# Patient Record
Sex: Female | Born: 1950
Health system: Southern US, Community
[De-identification: ages and names within clinical notes are randomized; demographics above are authoritative.]

## PROBLEM LIST (undated history)

## (undated) DIAGNOSIS — H9319 Tinnitus, unspecified ear: Secondary | ICD-10-CM

## (undated) DIAGNOSIS — K589 Irritable bowel syndrome without diarrhea: Secondary | ICD-10-CM

## (undated) DIAGNOSIS — K649 Unspecified hemorrhoids: Secondary | ICD-10-CM

## (undated) DIAGNOSIS — I341 Nonrheumatic mitral (valve) prolapse: Secondary | ICD-10-CM

## (undated) DIAGNOSIS — F32A Depression, unspecified: Secondary | ICD-10-CM

## (undated) DIAGNOSIS — K56609 Unspecified intestinal obstruction, unspecified as to partial versus complete obstruction: Secondary | ICD-10-CM

## (undated) DIAGNOSIS — E039 Hypothyroidism, unspecified: Secondary | ICD-10-CM

## (undated) DIAGNOSIS — K602 Anal fissure, unspecified: Secondary | ICD-10-CM

## (undated) DIAGNOSIS — M1990 Unspecified osteoarthritis, unspecified site: Secondary | ICD-10-CM

## (undated) DIAGNOSIS — F329 Major depressive disorder, single episode, unspecified: Secondary | ICD-10-CM

## (undated) DIAGNOSIS — K624 Stenosis of anus and rectum: Secondary | ICD-10-CM

## (undated) DIAGNOSIS — M810 Age-related osteoporosis without current pathological fracture: Secondary | ICD-10-CM

## (undated) HISTORY — PX: ROTATOR CUFF REPAIR: SHX139

## (undated) HISTORY — DX: Unspecified hemorrhoids: K64.9

## (undated) HISTORY — DX: Depression, unspecified: F32.A

## (undated) HISTORY — PX: COLONOSCOPY: SHX174

## (undated) HISTORY — DX: Age-related osteoporosis without current pathological fracture: M81.0

## (undated) HISTORY — PX: OTHER SURGICAL HISTORY: SHX169

## (undated) HISTORY — PX: BREAST BIOPSY: SHX20

## (undated) HISTORY — DX: Hypothyroidism, unspecified: E03.9

## (undated) HISTORY — DX: Unspecified osteoarthritis, unspecified site: M19.90

## (undated) HISTORY — DX: Major depressive disorder, single episode, unspecified: F32.9

## (undated) HISTORY — PX: ANAL FISSURE REPAIR: SHX2312

## (undated) HISTORY — PX: MOUTH SURGERY: SHX715

## (undated) HISTORY — PX: DENTAL SURGERY: SHX609

## (undated) HISTORY — DX: Tinnitus, unspecified ear: H93.19

## (undated) HISTORY — DX: Nonrheumatic mitral (valve) prolapse: I34.1

## (undated) HISTORY — DX: Unspecified intestinal obstruction, unspecified as to partial versus complete obstruction: K56.609

## (undated) HISTORY — DX: Stenosis of anus and rectum: K62.4

## (undated) HISTORY — DX: Anal fissure, unspecified: K60.2

## (undated) HISTORY — DX: Irritable bowel syndrome, unspecified: K58.9

---

## 1998-10-30 HISTORY — PX: CYSTOSCOPY W/ SPINCTEROTOMY: SUR378

## 1999-08-19 ENCOUNTER — Ambulatory Visit (HOSPITAL_COMMUNITY): Admission: RE | Admit: 1999-08-19 | Discharge: 1999-08-19 | Payer: Self-pay | Admitting: General Surgery

## 1999-12-06 ENCOUNTER — Other Ambulatory Visit: Admission: RE | Admit: 1999-12-06 | Discharge: 1999-12-06 | Payer: Self-pay | Admitting: Obstetrics & Gynecology

## 2001-01-14 ENCOUNTER — Other Ambulatory Visit: Admission: RE | Admit: 2001-01-14 | Discharge: 2001-01-14 | Payer: Self-pay | Admitting: Obstetrics & Gynecology

## 2002-02-12 ENCOUNTER — Other Ambulatory Visit: Admission: RE | Admit: 2002-02-12 | Discharge: 2002-02-12 | Payer: Self-pay | Admitting: Obstetrics and Gynecology

## 2003-02-19 ENCOUNTER — Other Ambulatory Visit: Admission: RE | Admit: 2003-02-19 | Discharge: 2003-02-19 | Payer: Self-pay | Admitting: Obstetrics & Gynecology

## 2003-04-08 ENCOUNTER — Encounter: Payer: Self-pay | Admitting: Internal Medicine

## 2004-03-16 ENCOUNTER — Other Ambulatory Visit: Admission: RE | Admit: 2004-03-16 | Discharge: 2004-03-16 | Payer: Self-pay | Admitting: Obstetrics & Gynecology

## 2004-04-14 ENCOUNTER — Encounter: Admission: RE | Admit: 2004-04-14 | Discharge: 2004-04-14 | Payer: Self-pay | Admitting: Obstetrics & Gynecology

## 2004-08-22 ENCOUNTER — Encounter: Admission: RE | Admit: 2004-08-22 | Discharge: 2004-08-22 | Payer: Self-pay | Admitting: Endocrinology

## 2004-08-22 ENCOUNTER — Encounter (INDEPENDENT_AMBULATORY_CARE_PROVIDER_SITE_OTHER): Payer: Self-pay | Admitting: *Deleted

## 2004-09-21 ENCOUNTER — Ambulatory Visit: Payer: Self-pay | Admitting: Internal Medicine

## 2004-09-30 ENCOUNTER — Ambulatory Visit (HOSPITAL_COMMUNITY): Admission: RE | Admit: 2004-09-30 | Discharge: 2004-09-30 | Payer: Self-pay | Admitting: Internal Medicine

## 2004-09-30 ENCOUNTER — Encounter (INDEPENDENT_AMBULATORY_CARE_PROVIDER_SITE_OTHER): Payer: Self-pay | Admitting: *Deleted

## 2004-10-17 ENCOUNTER — Encounter: Admission: RE | Admit: 2004-10-17 | Discharge: 2004-10-17 | Payer: Self-pay | Admitting: Obstetrics & Gynecology

## 2005-03-28 ENCOUNTER — Encounter: Admission: RE | Admit: 2005-03-28 | Discharge: 2005-03-28 | Payer: Self-pay | Admitting: Obstetrics & Gynecology

## 2005-04-04 ENCOUNTER — Other Ambulatory Visit: Admission: RE | Admit: 2005-04-04 | Discharge: 2005-04-04 | Payer: Self-pay | Admitting: Obstetrics & Gynecology

## 2006-02-22 ENCOUNTER — Ambulatory Visit: Payer: Self-pay | Admitting: Internal Medicine

## 2006-04-03 ENCOUNTER — Emergency Department (HOSPITAL_COMMUNITY): Admission: EM | Admit: 2006-04-03 | Discharge: 2006-04-03 | Payer: Self-pay | Admitting: *Deleted

## 2006-04-30 ENCOUNTER — Encounter: Admission: RE | Admit: 2006-04-30 | Discharge: 2006-04-30 | Payer: Self-pay | Admitting: Obstetrics & Gynecology

## 2006-08-15 ENCOUNTER — Ambulatory Visit: Payer: Self-pay | Admitting: Internal Medicine

## 2007-05-07 ENCOUNTER — Encounter: Admission: RE | Admit: 2007-05-07 | Discharge: 2007-05-07 | Payer: Self-pay | Admitting: Obstetrics & Gynecology

## 2008-01-24 ENCOUNTER — Ambulatory Visit: Payer: Self-pay | Admitting: Vascular Surgery

## 2008-05-20 ENCOUNTER — Encounter: Admission: RE | Admit: 2008-05-20 | Discharge: 2008-05-20 | Payer: Self-pay | Admitting: Obstetrics & Gynecology

## 2009-05-14 ENCOUNTER — Telehealth: Payer: Self-pay | Admitting: Internal Medicine

## 2009-05-25 ENCOUNTER — Encounter: Admission: RE | Admit: 2009-05-25 | Discharge: 2009-05-25 | Payer: Self-pay | Admitting: Obstetrics & Gynecology

## 2010-01-11 ENCOUNTER — Encounter (INDEPENDENT_AMBULATORY_CARE_PROVIDER_SITE_OTHER): Payer: Self-pay | Admitting: *Deleted

## 2010-01-11 ENCOUNTER — Encounter: Admission: RE | Admit: 2010-01-11 | Discharge: 2010-01-11 | Payer: Self-pay

## 2010-01-12 ENCOUNTER — Encounter (INDEPENDENT_AMBULATORY_CARE_PROVIDER_SITE_OTHER): Payer: Self-pay | Admitting: *Deleted

## 2010-01-12 ENCOUNTER — Inpatient Hospital Stay (HOSPITAL_COMMUNITY): Admission: EM | Admit: 2010-01-12 | Discharge: 2010-01-20 | Payer: Self-pay | Admitting: Emergency Medicine

## 2010-01-13 ENCOUNTER — Encounter (INDEPENDENT_AMBULATORY_CARE_PROVIDER_SITE_OTHER): Payer: Self-pay | Admitting: *Deleted

## 2010-01-14 ENCOUNTER — Encounter (INDEPENDENT_AMBULATORY_CARE_PROVIDER_SITE_OTHER): Payer: Self-pay | Admitting: *Deleted

## 2010-01-17 ENCOUNTER — Encounter (INDEPENDENT_AMBULATORY_CARE_PROVIDER_SITE_OTHER): Payer: Self-pay | Admitting: *Deleted

## 2010-01-18 ENCOUNTER — Ambulatory Visit: Payer: Self-pay | Admitting: Internal Medicine

## 2010-01-18 ENCOUNTER — Encounter (INDEPENDENT_AMBULATORY_CARE_PROVIDER_SITE_OTHER): Payer: Self-pay | Admitting: *Deleted

## 2010-01-19 ENCOUNTER — Encounter (INDEPENDENT_AMBULATORY_CARE_PROVIDER_SITE_OTHER): Payer: Self-pay | Admitting: *Deleted

## 2010-01-26 DIAGNOSIS — Z8719 Personal history of other diseases of the digestive system: Secondary | ICD-10-CM | POA: Insufficient documentation

## 2010-01-26 DIAGNOSIS — Z872 Personal history of diseases of the skin and subcutaneous tissue: Secondary | ICD-10-CM | POA: Insufficient documentation

## 2010-01-26 DIAGNOSIS — K589 Irritable bowel syndrome without diarrhea: Secondary | ICD-10-CM | POA: Insufficient documentation

## 2010-01-26 DIAGNOSIS — K645 Perianal venous thrombosis: Secondary | ICD-10-CM | POA: Insufficient documentation

## 2010-01-26 DIAGNOSIS — E039 Hypothyroidism, unspecified: Secondary | ICD-10-CM | POA: Insufficient documentation

## 2010-01-26 DIAGNOSIS — K5900 Constipation, unspecified: Secondary | ICD-10-CM | POA: Insufficient documentation

## 2010-01-26 DIAGNOSIS — I059 Rheumatic mitral valve disease, unspecified: Secondary | ICD-10-CM | POA: Insufficient documentation

## 2010-01-31 ENCOUNTER — Ambulatory Visit: Payer: Self-pay | Admitting: Internal Medicine

## 2010-03-20 ENCOUNTER — Telehealth: Payer: Self-pay | Admitting: Gastroenterology

## 2010-03-20 ENCOUNTER — Emergency Department (HOSPITAL_COMMUNITY): Admission: EM | Admit: 2010-03-20 | Discharge: 2010-03-21 | Payer: Self-pay | Admitting: Emergency Medicine

## 2010-03-20 ENCOUNTER — Encounter: Payer: Self-pay | Admitting: Internal Medicine

## 2010-03-21 ENCOUNTER — Telehealth: Payer: Self-pay | Admitting: Internal Medicine

## 2010-03-21 ENCOUNTER — Encounter: Payer: Self-pay | Admitting: Internal Medicine

## 2010-03-29 ENCOUNTER — Ambulatory Visit: Payer: Self-pay | Admitting: Internal Medicine

## 2010-03-29 DIAGNOSIS — R109 Unspecified abdominal pain: Secondary | ICD-10-CM | POA: Insufficient documentation

## 2010-04-11 ENCOUNTER — Ambulatory Visit (HOSPITAL_COMMUNITY): Admission: RE | Admit: 2010-04-11 | Discharge: 2010-04-11 | Payer: Self-pay | Admitting: Internal Medicine

## 2010-05-24 ENCOUNTER — Encounter: Payer: Self-pay | Admitting: Internal Medicine

## 2010-06-23 ENCOUNTER — Encounter: Admission: RE | Admit: 2010-06-23 | Discharge: 2010-06-23 | Payer: Self-pay | Admitting: Obstetrics & Gynecology

## 2010-11-29 NOTE — Letter (Signed)
Summary: New Patient letter  [facility]  [address] [place], [place] [address]   Phone: [phone]  Fax: [phone]       01/19/2010 MRN: [id number]  Karen Mccarthy [address] [place], [place]  [address]  Dear Ms. Zimmers,  Welcome to the Gastroenterology Division at [facility].    You are scheduled to see Dr. Lina Sar on 01-31-10 at 9am,  on the 3rd floor at [facility], [address]  We ask that you try to arrive at our office 15 minutes prior to your appointment time to allow for check-in.  We would like you to complete the enclosed self-administered evaluation form prior to your visit and bring it with you on the day of your appointment.  We will review it with you.  Also, please bring a complete list of all your medications or, if you prefer, bring the medication bottles and we will list them.  Please bring your insurance card so that we may make a copy of it.  If your insurance requires a referral to see a specialist, please bring your referral form from your primary care physician.  Co-payments are due at the time of your visit and may be paid by cash, check or credit card.     Your office visit will consist of a consult with your physician (includes a physical exam), any laboratory testing he/she may order, scheduling of any necessary diagnostic testing (e.g. x-ray, ultrasound, CT-scan), and scheduling of a procedure (e.g. Endoscopy, Colonoscopy) if required.  Please allow enough time on your schedule to allow for any/all of these possibilities.    If you cannot keep your appointment, please call [phone] to cancel or reschedule prior to your appointment date.  This allows [place] the opportunity to schedule an appointment for another patient in need of care.  If you do not cancel or reschedule by 5 p.m. the business day prior to your appointment date, you will be charged a $50.00 late cancellation/no-show fee.    Thank you for choosing   Gastroenterology for your medical needs.  We appreciate the opportunity to care for you.  Please visit [place] at our website  to learn more about our practice.                     Sincerely,                                                             The Gastroenterology Division   Appended Document: New Patient letter Letter mailed to patient.

## 2010-11-29 NOTE — Progress Notes (Signed)
Summary: Triage   Phone Note Call from Patient Call back at [phone] Cell   Caller: Patient Call For: Dr. Juanda Chance Reason for Call: Talk to Nurse Summary of Call: Pt was seen in ER last night for severe abd pain and vomiting. She said her CT scan and showed some inflammation in colon and constipation in bowel. Was told to f/u in a few days Initial call taken by: Karna Christmas,  Mar 21, 2010 8:08 AM  Follow-up for Phone Call        Patient  was seen in the ER last night for abdominal pain, nausea and vomiting.  She was treated for colitis and asked to follow up with Dr Juanda Chance.  Patient  will be scheduled to see Dr Juanda Chance 03/29/10 9:00.  Patient  was sent home for the ER at 7:30 this am.  She is asked to continue the antibiotics prescribed to her and call us back if she developes new symptoms and then we will work her in with the RNP as Dr Juanda Chance is the hospital MD this week.  Patient  verbalized understanding to call back if she developes nausea and  vomiting or abdominal pain.    Follow-up by: Darcey Nora RN, CGRN,  Mar 21, 2010 9:11 AM

## 2010-11-29 NOTE — Letter (Signed)
Summary: Nausea, Vomiting, Abdominal Pain  NAME:  Karen Mccarthy, Karen Mccarthy                ACCOUNT NO.:  [id number]      MEDICAL RECORD NO.:  [id number]          PATIENT TYPE:  INP      LOCATION:  1337                         FACILITY:  [facility]      PHYSICIAN:  Peggye Pitt, M.D. DATE OF BIRTH:  03-10-1951                                    PROGRESS NOTE      DATE OF DISCHARGE:   Still to be determined.      DIAGNOSES UP TO DATE:   1. Nausea, vomiting and abdominal pain, improved, presumed secondary       to ileus versus partial small bowel obstruction.   2. Hypothyroidism.   3. Irritable bowel syndrome of the constipation type.   4. Mitral valve prolapse.   5. History of hemorrhoids and anal fissures.      HOSPITAL COURSE:   [name] is a very pleasant 60 year old Caucasian lady, initially   admitted on March 16 with nausea, vomiting and abdominal pain.  She   denied any sick contacts, any fevers or chills.  She states she had a   normal colonoscopy in 2004 and was told that she does not need another   one until 10 years.  She had a CAT scan in the emergency department that   showed a normal appendix with slightly dilated distal small bowel loops   and a question of partial small bowel obstruction, some left hepatic   cyst, but was otherwise negative.  [name] has had a very slow   clinical resolution of her partial small bowel obstruction, although on   subsequent abdominal x-rays, it has resolved.  Dr. Magnus Ivan did order an   upper GI study that did not show any obvious causes of obstruction or   stricturing, but she did have very delayed transit.  She is now on a   full liquid diet.  She has not had any emesis for the past 2 days.  She   has been having some loose bowel movements which she thinks are mostly   the barium that she received for the upper GI study.  Her abdominal pain   has resolved.  Dr. Magnus Ivan has recommended that GI see her.  [name]   has seen Dr. Lina Sar  with [facility] in the past, and I have asked   them to consult on her case.  Hopefully, Ms. Certain will not have any   further emesis and will be able to advance her diet and hopefully   discharge her home within the next 1-2 days.               Peggye Pitt, M.D.            EH/MEDQ  D:  01/18/2010  T:  01/18/2010  Job:  [id number]

## 2010-11-29 NOTE — Progress Notes (Signed)
   Phone Note Call from Patient   Caller: Patient Summary of Call: On call note. Pt relates lower abdominal pain and vomiting that started this afternoon has has worsened. Recently hospitalized for similar problem. Advised to have someone drive her to [facility] ED for evaluation. She agrees. Initial call taken by: Meryl Dare MD Clementeen Graham,  Mar 21, 2010 9:47 AM

## 2010-11-29 NOTE — Procedures (Signed)
Summary: COLON   Colonoscopy  Procedure date:  04/08/2003  Findings:      Location:  [facility].    Procedures Next Due Date:    Colonoscopy: 03/2013 Patient Name: Lataya, Varnell MRN:  Procedure Procedures: Colonoscopy CPT: 32951.  Personnel: Endoscopist: Deziah Renwick L. Juanda Chance, MD.  Referred By: Freddy Finner, M.D.  Exam Location: Exam performed in Outpatient Clinic. Outpatient  Patient Consent: Procedure, Alternatives, Risks and Benefits discussed, consent obtained, from patient. Consent was obtained by the RN.  Indications Symptoms: Constipation  Average Risk Screening Routine.  History  Pre-Exam Physical: Performed Apr 08, 2003. Cardio-pulmonary exam, Rectal exam, HEENT exam , Abdominal exam, Extremity exam, Neurological exam, Mental status exam WNL.  Exam Exam: Extent of exam reached: Cecum, extent intended: Cecum.  The cecum was identified by appendiceal orifice and IC valve. Colon retroflexion performed. Images taken. ASA Classification: I. Tolerance: good.  Monitoring: Pulse and BP monitoring, Oximetry used. Supplemental O2 given.  Colon Prep Used Phospho Soda for colon prep. Prep results: good.  Sedation Meds: Patient assessed and found to be appropriate for moderate (conscious) sedation. Fentanyl 150 mcg. given IV. Versed 12 given IV.  Findings NORMAL EXAM: Cecum.  NORMAL EXAM: Rectum.   Assessment Normal examination.  Comments: thick folds left colon c/w prediverticular changes of the left colon Events  Unplanned Interventions: No intervention was required.  Unplanned Events: There were no complications. Plans Medication Plan: Miralax: 17gm QD, starting Apr 08, 2003   Patient Education: Patient given standard instructions for: Constipation.Yearly hemoccult testing recommended. Patient instructed to get routine colonoscopy every 10 years.  Disposition: After procedure patient sent to recovery. After recovery patient sent home.    This report was created from the original endoscopy report, which was reviewed and signed by the above listed endoscopist.

## 2010-11-29 NOTE — Assessment & Plan Note (Signed)
Summary: post ER visit / abdominal /Karen Mccarthy    History of Present Illness Visit Type: Follow-up Visit Primary GI MD: Lina Sar MD Primary Provider: Theodis Blaze, [facility] Requesting Provider: na Chief Complaint: Post ER f/u for lower abd pain. Pt c/o weight loss and constipation   History of Present Illness:   This is a 60 year old white female with intermittent abdominal pain and a suspected partial small bowel obstruction. She was hospitalized in March 2011 with abdominal pain and an abnormal CT scan suggestive of a small bowel obstruction. A small bowel follow through showed prolonged transit time lasting more than 24 hours. She had recurrent pain in May and was evaluated in the emergency room where her KUB showed loops of the dilated small bowel in the left upper quadrant. Her repeat CT scan showed stranding in the small bowel mesentery as well as in the left lower quadrantraisung a question of colitis. Her last colonoscopy in 2004 was normal. There is a family history of gallbladder disease in several cousins and a maternal aunts. She would like to have her gall bladder evaluated further. She has been maintaining a low residue diet. Her weight has decreased from 117 pounds to 110 pounds because she has been afraid to eat.   GI Review of Systems      Denies abdominal pain, acid reflux, belching, bloating, chest pain, dysphagia with liquids, dysphagia with solids, heartburn, loss of appetite, nausea, vomiting, vomiting blood, weight loss, and  weight gain.      Reports constipation.     Denies anal fissure, black tarry stools, change in bowel habit, diarrhea, diverticulosis, fecal incontinence, heme positive stool, hemorrhoids, irritable bowel syndrome, jaundice, light color stool, liver problems, rectal bleeding, and  rectal pain.    Current Medications (verified): 1)  Budeprion Xl 150 Mg Xr24h-Tab (Bupropion Hcl) .... Take 1 Tablet By Mouth Once A Day 2)  Calcium-Magnesium Powder .... Take 1  Packet Per Day 3)  Clonazepam 0.5 Mg Tabs (Clonazepam) .... Take 1 Tab By Mouth At Bedtime As Needed For Insomnia 4)  Colace 100 Mg Caps (Docusate Sodium) .... Take 3 Capsules By Mouth Once Daily 5)  Flax Seed Oil 1000 Mg Caps (Flaxseed (Linseed)) .... Take 1 Capsule By Mouth Once Daily 6)  Magnesium Oxide 400 Mg Tabs (Magnesium Oxide) .... Take 1 Tablet By Mouth Once A Day 7)  Synthroid 75 Mcg Tabs (Levothyroxine Sodium) .... Take 1 Tablet Every Other Day and Alternate With 50 Micrograms Tablet 8)  Valtrex 1 Gm Tabs (Valacyclovir Hcl) .... Take 1/2 Tablet By Mouth As Needed For Outbreaks 9)  Vitamin D3 1000 Unit Tabs (Cholecalciferol) .... Take 1 Tablet Daily 10)  Vivelle-Dot 0.05 Mg/24hr Pttw (Estradiol) .... Apply 1 Patch Transdermally Every 3 Days 11)  Zaleplon 10 Mg Caps (Zaleplon) .... As Needed For Sleep 12)  Prometrium 200 Mg Caps (Progesterone Micronized) .... As Directed 13)  Anamantle Hc 3-0.5 % Crea (Lidocaine-Hydrocortisone Ace) .... As Needed 14)  Miralax  Powd (Polyethylene Glycol 3350) .... Once Daily 15)  Aspirin 325 Mg Tabs (Aspirin) .... Two-Four By Mouth Once Daily For Wrist Pain  Allergies (verified): 1)  ! Pcn 2)  ! Flagyl 3)  ! Ampicillin  Past History:  Past Medical History: Reviewed history from 01/31/2010 and no changes required. Current Problems:  HEMORRHOIDS, HX OF (ICD-V12.79) MITRAL VALVE PROLAPSE (ICD-424.0) HYPOTHYROIDISM (ICD-244.9) SMALL BOWEL OBSTRUCTION, HX OF (ICD-V12.79) CONSTIPATION (ICD-564.00) Hx of HEMORRHOID, THROMBOSED (ICD-455.7) ANAL FISSURE, HX OF (ICD-V13.3) IRRITABLE BOWEL SYNDROME (ICD-564.1)  Arthritis Depression  Past Surgical History: Reviewed history from 01/26/2010 and no changes required. Repair of Anal Fissure Abortion D & C Spincterotomy  Family History: Family History of Laryngeal Cancer: Father Family History of Prostate Cancer: Father Family History of Colitis: Siblings Family History of Inflammatory Bowel  Disease: Siblings Family History of Breast Cancer: Maternal Aunt Family History of Heart Disease: Father, Grandfather No FH of Colon Cancer:  Social History: Reviewed history from 01/31/2010 and no changes required. Occupation: Retired Runner, broadcasting/film/video Alcohol Use - yes-2 drinks per week Illicit Drug Use - no Patient has never smoked.  Daily Caffeine Use 2-3  Review of Systems  The patient denies allergy/sinus, anemia, anxiety-new, arthritis/joint pain, back pain, blood in urine, breast changes/lumps, change in vision, confusion, cough, coughing up blood, depression-new, fainting, fatigue, fever, headaches-new, hearing problems, heart murmur, heart rhythm changes, itching, menstrual pain, muscle pains/cramps, night sweats, nosebleeds, pregnancy symptoms, shortness of breath, skin rash, sleeping problems, sore throat, swelling of feet/legs, swollen lymph glands, thirst - excessive , urination - excessive , urination changes/pain, urine leakage, vision changes, and voice change.         Pertinent positive and negative review of systems were noted in the above HPI. All other ROS was otherwise negative.   Vital Signs:  Patient profile:   60 year old female Height:      62 inches Weight:      110 pounds BMI:     20.19 BSA:     1.48 Pulse rate:   88 / minute Pulse rhythm:   regular BP sitting:   100 / 60  (left arm) Cuff size:   regular  Vitals Entered By: Ok Anis CMA (Mar 29, 2010 8:50 AM)  Physical Exam  General:  alert, oriented and in no distress. Mouth:  No deformity or lesions, dentition normal. Neck:  Supple; no masses or thyromegaly. Lungs:  Clear throughout to auscultation. Heart:  Regular rate and rhythm; no murmurs, rubs,  or bruits. Abdomen:  soft abdomen with decreased bowel sounds. No distention. No tenderness. No abnormal bowel sounds. Liver edge at costal margin. Rectal:  normal rectal tone was somewhat tender digital exam. Stool is Hemoccult negative. Extremities:  No  clubbing, cyanosis, edema or deformities noted. Skin:  Intact without significant lesions or rashes. Psych:  Alert and cooperative. Normal mood and affect.   Impression & Recommendations:  Problem # 1:  SMALL BOWEL OBSTRUCTION, HX OF (ICD-V12.79) Patient has intermittent abdominal pain suggestive ofa partial small bowel obstruction. There is no convincing evidence of a  transition point. A CT scan suggested the location between the jejunal - ileal  junction but the most recent CT scan also shows stranding around the left colon. There is a strong family history of gallbladder disease in and her pain in the upper abdomen could be suggestive of biliary dysfunction. We will proceed with a HIDA scan with CCK and as well as with colonoscopy. Her sister has colitis of  sort for which she is being treated. Patient is naturally frustrated about having recurrent abdominal pain. It may take several episodes before we can reevaluate it for laparoscopic surgery.  Problem # 2:  CONSTIPATION (ICD-564.00) Patient is to continue MiraLax 9-17 g daily and stool softeners. A colonoscopy has been scheduled for the near future.  Problem # 3:  ANAL FISSURE, HX OF (ICD-V13.3) Patient has a history of an internal sphincterotomy and anal fissurectomy. She is currently asymptomatic.  Other Orders: Colonoscopy (Colon) HIDA CCK (HIDA CCK)  Patient  Instructions: 1)  low residue diet. 2)  Schedule colonoscopy with MiraLax prep. 3)  HIDA scan with CCK. 4)  Copy sent to : Dr R.Pang 5)  The medication list was reviewed and reconciled.  All changed / newly prescribed medications were explained.  A complete medication list was provided to the patient / caregiver. Prescriptions: DULCOLAX 5 MG  TBEC (BISACODYL) Day before procedure take 2 at 3pm and 2 at 8pm.  #4 x 0   Entered by:   Lamona Curl CMA (AAMA)   Authorized by:   Hart Carwin MD   Signed by:   Lamona Curl CMA (AAMA) on 03/29/2010   Method used:    Electronically to        [facility] [phone]* (retail)       [address], [place]  [address]       Ph: [phone] or [phone]       Fax: [phone]   RxID:   [id number] REGLAN 10 MG  TABS (METOCLOPRAMIDE HCL) As per prep instructions.  #2 x 0   Entered by:   Lamona Curl CMA (AAMA)   Authorized by:   Hart Carwin MD   Signed by:   Lamona Curl CMA (AAMA) on 03/29/2010   Method used:   Electronically to        [facility]. [phone]* (retail)       [address] [place]  [address]       Ph: [phone] or [phone]       Fax: [phone]   RxID:   [id number] MIRALAX   POWD (POLYETHYLENE GLYCOL 3350) As per prep  instructions.  #255gm x 0   Entered by:   Lamona Curl CMA (AAMA)   Authorized by:   Hart Carwin MD   Signed by:   Lamona Curl CMA (AAMA) on 03/29/2010   Method used:   [facility]ronically to        [facility] [phone]* (retail)       [address]       [place], [place]  [address]       Ph: [phone] or [phone]       Fax: [phone]   RxID:   [id number]

## 2010-11-29 NOTE — Consult Note (Signed)
Summary: Abdominal Pain    NAME:  Karen Mccarthy, Karen Mccarthy                ACCOUNT NO.:  [id number]      MEDICAL RECORD NO.:  [id number]          PATIENT TYPE:  INP      LOCATION:  0102                         FACILITY:  [facility]      PHYSICIAN:  Abigail Miyamoto, M.D. DATE OF BIRTH:  [birth year]      DATE OF CONSULTATION:  01/12/2010   DATE OF DISCHARGE:                                    CONSULTATION      REFERRING PHYSICIAN:  [facility] hospitalist, Marcellus Scott, MD.      CHIEF COMPLAINT:  Abdominal pain, nausea and vomiting.      HISTORY:  This is a 60 year old female with a history of irritable bowel   syndrome who awoke yesterday with the sudden onset of diffuse deep   abdominal pain and pressure.  She said it was more cramping and not   sharp.  She developed nausea and many episodes of emesis with this.  [facility]   [facility] and was sent for a CAT scan of the   abdomen and then to the hospital.  She is being admitted by the   hospitalist for the above-mentioned complaints.  The patient reports she   has not moved her bowels in over a day and is currently not passing any   flatus.  Most of the pain now is in her lower abdomen, although it   started in the epigastrium.  She has had no hematemesis with this.  She   denies fevers or chills.  She reports that she did eat some barbecue   prior to the events happening.  Otherwise she is without complaints.      PAST MEDICAL HISTORY/SURGICAL HISTORY:  Significant for:   1. History of irritable bowel syndrome.   2. She has had previous sphincterotomy.   3. She has a history of intermittent severe constipation and has had       to have disimpaction several times per her report.   4. She also has hypothyroidism and hemorrhoids in the past.   5. She has had a colonoscopy in 2004.      MEDICATIONS:  Please see the medication reconciliation form.      ALLERGIES:  PENICILLIN, FLAGYL AND AMPICILLIN.      SOCIAL HISTORY:  She denies  tobacco use.  She does use alcohol socially.      FAMILY HISTORY:  Positive for prostate problems, diabetes and   hypertension.      REVIEW OF SYSTEMS:  GENERAL:  Negative for fever, chills.  PULMONARY:   Negative for cough, shortness of breath or difficulty breathing.   CARDIAC:  Negative for chest pain or irregular heartbeat.  GI:  As   listed above.   URINARY:  Negative for dysuria, hematuria.  The rest of review of   systems including SKIN, EYES, EARS, NOSE AND THROAT, MUSCULOSKELETAL,   NEUROLOGIC, PSYCHIATRIC, ENDOCRINE:  Are normal except for depression,   anxiety.      PHYSICAL EXAMINATION:  Reveals a well-developed, well-nourished female  who is in mild discomfort.  Blood pressure 123/84, temperature 98.4.   Heart rate 76, respiratory rate 16.  She is satting 99% on room air.   GENERALLY:  She is mildly uncomfortable in appearance.   EYES:  Anicteric.  Pupils reactive bilaterally.  ENT:  External ears,   nose are normal.  Hearing is normal.  Oropharynx is clear.   NECK:  Supple.  There is no cervical adenopathy.  There is no   thyromegaly.  Trachea is midline.   CARDIOVASCULAR:  Regular rate and rhythm.  There are no murmurs.  There   is no peripheral edema.  Pulses are intact to all 4 extremities.   ABDOMEN:  Soft.  There is only mild tenderness with guarding mostly in   the mid and lower abdomen.  There are no hernias.  There is no   organomegaly.   EXTREMITIES:  Are warm and well perfused.  No edema, cyanosis.   Peripheral pulses are intact in all four extremities.   SKIN:  Shows no rashes and no jaundice.      DATA REVIEWED:  The patient has laboratory data showing her to have a   white blood count of 8.6 but there is a left shift.  Hemoglobin is 14.9.   Potassium 3.2, creatinine 0.81.  lipase, liver function tests,   urinalysis are normal.  The patient had a CAT scan of the abdomen and   pelvis which shows dilated loops of small bowel.  There is no obvious   cause  for this.  There are no large intra-abdominal masses or internal   hernias or inflammatory processes.      IMPRESSION:  This is a patient with abdominal pain of uncertain   etiology.  Certainly this may represent a partial small-bowel   obstruction versus an ileus from gastroenteritis.  Again, she has had no   previous intra-abdominal surgery and there is no evidence of any kind of   mass, obstructing lesion or internal hernia on CAT scan.  At this point   I would recommend bowel rest as is being done.  I have discussed   nasogastric tube placement with her.  I would highly recommend this to   avoid any aspiration to see if this will improve her discomfort but   currently she is refusing.  I discussed risks of aspiration again with   her and explained my reasons for this.  I would also recommend   correcting hypokalemia which is already being done and IV hydration.  I   am going to add Reglan and Dulcolax suppository as well.  I will follow   along with you.  We will repeat her x-rays in the morning.               Abigail Miyamoto, M.D.            DB/MEDQ  D:  01/12/2010  T:  01/12/2010  Job:  [id number]      cc:   Marcellus Scott, MD      Electronically Signed by Abigail Miyamoto M.D. on 01/19/2010 02:01:35 PM

## 2010-11-29 NOTE — Assessment & Plan Note (Signed)
Summary: POST HOSPITAL F/U              [facility]    History of Present Illness Visit Type: Initial Visit Primary GI MD: Lina Sar MD Primary Provider: Theodis Blaze, MG Chief Complaint: Post hospital, nausea and vomiting have stopped; patient still having abdominal discomfort not pain History of Present Illness:   60 year old white female, status post hospitalization for partial small bowel obstruction. No transition point was found. CT Scan of the abdomen showed abnormal cecum , small bowel follow-through showed  slow transit time with delayed passage of the barium for  more than 24-hour . She is 95% improved but  still staying on low residue diet. There is a history of severe functional constipation. Last colonoscopy June 2004. There's a history of anal fissure in 2000.   GI Review of Systems    Reports abdominal pain, bloating, nausea, and  vomiting.      Denies acid reflux, belching, chest pain, dysphagia with liquids, dysphagia with solids, heartburn, loss of appetite, vomiting blood, weight loss, and  weight gain.      Reports change in bowel habits, constipation, diarrhea, hemorrhoids, and  irritable bowel syndrome.     Denies anal fissure, black tarry stools, diverticulosis, fecal incontinence, heme positive stool, jaundice, light color stool, liver problems, rectal bleeding, and  rectal pain. Preventive Screening-Counseling & Management  Alcohol-Tobacco     Smoking Status: never    Current Medications (verified): 1)  Budeprion Xl 150 Mg Xr24h-Tab (Bupropion Hcl) .... Take 1 Tablet By Mouth Once A Day 2)  Calcium-Magnesium Powder .... Take 1 Packet Per Day 3)  Clonazepam 0.5 Mg Tabs (Clonazepam) .... Take 1 Tab By Mouth At Bedtime As Needed For Insomnia 4)  Colace 100 Mg Caps (Docusate Sodium) .... Take 3 Capsules By Mouth Once Daily 5)  Flax Seed Oil 1000 Mg Caps (Flaxseed (Linseed)) .... Take 1 Capsule By Mouth Once Daily 6)  Magnesium Oxide 400 Mg Tabs (Magnesium Oxide)  .... Take 1 Tablet By Mouth Once A Day 7)  Synthroid 75 Mcg Tabs (Levothyroxine Sodium) .... Take 1 Tablet Every Other Day and Alternate With 50 Micrograms Tablet 8)  Valtrex 1 Gm Tabs (Valacyclovir Hcl) .... Take 1/2 Tablet By Mouth As Needed For Outbreaks 9)  Vitamin D3 1000 Unit Tabs (Cholecalciferol) .... Take 1 Tablet Daily 10)  Vivelle-Dot 0.05 Mg/24hr Pttw (Estradiol) .... Apply 1 Patch Transdermally Every 3 Days 11)  Zaleplon 10 Mg Caps (Zaleplon) .... As Needed For Sleep 12)  Prometrium 200 Mg Caps (Progesterone Micronized) .... As Directed 13)  Anamantle Hc 3-0.5 % Crea (Lidocaine-Hydrocortisone Ace) .... As Needed  Allergies (verified): 1)  ! Pcn 2)  ! Flagyl 3)  ! Ampicillin  Past History:  Past Medical History: Current Problems:  HEMORRHOIDS, HX OF (ICD-V12.79) MITRAL VALVE PROLAPSE (ICD-424.0) HYPOTHYROIDISM (ICD-244.9) SMALL BOWEL OBSTRUCTION, HX OF (ICD-V12.79) CONSTIPATION (ICD-564.00) Hx of HEMORRHOID, THROMBOSED (ICD-455.7) ANAL FISSURE, HX OF (ICD-V13.3) IRRITABLE BOWEL SYNDROME (ICD-564.1)   Arthritis Depression  Past Surgical History: Reviewed history from 01/26/2010 and no changes required. Repair of Anal Fissure Abortion D & C Spincterotomy  Social History: Occupation: Retired Runner, broadcasting/film/video Alcohol Use - yes-2 drinks per week Illicit Drug Use - no Patient has never smoked.  Daily Caffeine Use 2-3 Smoking Status:  never  Review of Systems       The patient complains of allergy/sinus, anxiety-new, and urination - excessive.  The patient denies anemia, arthritis/joint pain, back pain, blood in urine, breast changes/lumps, change  in vision, confusion, cough, coughing up blood, depression-new, fainting, fatigue, fever, headaches-new, hearing problems, heart murmur, heart rhythm changes, itching, menstrual pain, muscle pains/cramps, night sweats, nosebleeds, pregnancy symptoms, shortness of breath, skin rash, sleeping problems, sore throat, swelling of  feet/legs, swollen lymph glands, thirst - excessive , urination - excessive , urination changes/pain, urine leakage, vision changes, and voice change.         Pertinent positive and negative review of systems were noted in the above HPI. All other ROS was otherwise negative.   Vital Signs:  Patient profile:   60 year old female Height:      62 inches Weight:      117.38 pounds BMI:     21.55 Pulse rate:   80 / minute Pulse rhythm:   regular BP sitting:   108 / 70  (left arm) Cuff size:   regular  Vitals Entered By: June McMurray CMA Duncan Dull) (January 31, 2010 9:13 AM)  Physical Exam  General:  Well developed, well nourished, no acute distress. Eyes:  PERRLA, no icterus. Mouth:  No deformity or lesions, dentition normal. Neck:  Supple; no masses or thyromegaly. Lungs:  Clear throughout to auscultation. Heart:  Regular rate and rhythm; no murmurs, rubs,  or bruits. Abdomen:  soft scaphoid abdomen with the abnormal bowel sounds with several high-pitched rushes. No distention. No tympany. No tenderness. No mass Rectal:  small external hemorrhoidal tags. Normal rectal tone, soft Hemoccult negative stool Msk:  Symmetrical with no gross deformities. Normal posture. Extremities:  No clubbing, cyanosis, edema or deformities noted. Skin:  Intact without significant lesions or rashes. Psych:  Alert and cooperative. Normal mood and affect.   Impression & Recommendations:  Problem # 1:  SMALL BOWEL OBSTRUCTION, HX OF (ICD-V12.79) partial small bowel obstruction versus small bowel hypomotility. No transition point found on small bowel follow through. History of chronic severe constipation. Much improved on dietary modifications and bowel rest  Problem # 2:  CONSTIPATION (ICD-564.00) currently well controlled on him probiotic. Magnesium 500 mg daily Colace 100 mg daily and flaxseed. Last colonoscopy 2004. Next colonoscopy 2014  Patient Instructions: 1)  lower to medium fiber diet. Avoid salads  corn and raw vegetables 2)  Continue laxative regimen. Office visit 3 months. 3)   Consider small bowel capsule endoscopy ,this would have to be done  despite  concern for possible retention of the capsule in the SB 4)  Copy sent to : Dr Lavone Orn

## 2010-11-29 NOTE — Letter (Signed)
Summary: [facility] Instructions  [facility]  [address] [place], [place] [address]   Phone: [phone]  Fax: [phone]       Karen Mccarthy    12-Feb-1951    MRN: [id number]       Procedure Day /Date: 05/17/10 Tuesday     Arrival Time: 7:30 am     Procedure Time: 8:30 am     Location of Procedure:                    _x _  [facility] (4th Floor)  PREPARATION FOR COLONOSCOPY WITH MIRALAX  Starting 5 days prior to your procedure (05/12/10) do not eat nuts, seeds, popcorn, corn, beans, peas,  salads, or any raw vegetables.  Do not take any fiber supplements (e.g. Metamucil, Citrucel, and Benefiber). ____________________________________________________________________________________________________   THE DAY BEFORE YOUR PROCEDURE         DATE: 05/16/10 DAY: Monday  1   Drink clear liquids the entire day-NO SOLID FOOD  2   Do not drink anything colored red or purple.  Avoid juices with pulp.  No orange juice.  3   Drink at least 64 oz. (8 glasses) of fluid/clear liquids during the day to prevent dehydration and help the prep work efficiently.  CLEAR LIQUIDS INCLUDE: Water Jello Ice Popsicles Tea (sugar ok, no milk/cream) Powdered fruit flavored drinks Coffee (sugar ok, no milk/cream) Gatorade Juice: apple, white grape, white cranberry  Lemonade Clear bullion, consomm, broth Carbonated beverages (any kind) Strained chicken noodle soup Hard Candy  4   Mix the entire bottle of Miralax with 64 oz. of Gatorade/Powerade in the morning and put in the refrigerator to chill.  5   At 3:00 pm take 2 Dulcolax/Bisacodyl tablets.  6   At 4:30 pm take one Reglan/Metoclopramide tablet.  7  Starting at 5:00 pm drink one 8 oz glass of the Miralax mixture every 15-20 minutes until you have finished drinking the entire 64 oz.  You should finish drinking prep around 7:30 or 8:00 pm.  8   If you are nauseated, you may take the 2nd Reglan/Metoclopramide tablet  at 6:30 pm.        9    At 8:00 pm take 2 more DULCOLAX/Bisacodyl tablets.        THE DAY OF YOUR PROCEDURE      DATE:  05/17/10 DAY: Tuesday  You may drink clear liquids until 6:30 am  (2 HOURS BEFORE PROCEDURE).   MEDICATION INSTRUCTIONS  Unless otherwise instructed, you should take regular prescription medications with a small sip of water as early as possible the morning of your procedure.        OTHER INSTRUCTIONS  You will need a responsible adult at least 62 years of age to accompany you and drive you home.   This person must remain in the waiting room during your procedure.  Wear loose fitting clothing that is easily removed.  Leave jewelry and other valuables at home.  However, you may wish to bring a book to read or an iPod/MP3 player to listen to music as you wait for your procedure to start.  Remove all body piercing jewelry and leave at home.  Total time from sign-in until discharge is approximately 2-3 hours.  You should go home directly after your procedure and rest.  You can resume normal activities the day after your procedure.  The day of your procedure you should not:   Drive   Make  legal decisions   Operate machinery   Drink alcohol   Return to work  You will receive specific instructions about eating, activities and medications before you leave.   The above instructions have been reviewed and explained to me by  Lamona Curl CMA Duncan Dull)  Mar 29, 2010 9:28 AM     I fully understand and can verbalize these instructions _____________________________ Date 03/29/10

## 2011-01-16 LAB — COMPREHENSIVE METABOLIC PANEL
ALT: 15 U/L (ref 0–35)
AST: 24 U/L (ref 0–37)
CO2: 26 mEq/L (ref 19–32)
Chloride: 106 mEq/L (ref 96–112)
GFR calc Af Amer: >60 mL/min (ref >60)
GFR calc non Af Amer: >60 mL/min (ref >60)
Glucose, Bld: 155 mg/dL — ABNORMAL HIGH (ref 70–99)
Sodium: 139 mEq/L (ref 135–145)
Total Bilirubin: 0.4 mg/dL (ref 0.3–1.2)

## 2011-01-16 LAB — URINALYSIS, ROUTINE W REFLEX MICROSCOPIC
Ketones, ur: 15 mg/dL — AB
Nitrite: NEGATIVE
Specific Gravity, Urine: 1.011 (ref 1.005–1.030)
pH: 7.5 (ref 5.0–8.0)

## 2011-01-16 LAB — DIFFERENTIAL
Lymphocytes Relative: 7 % — ABNORMAL LOW (ref 12–46)
Lymphs Abs: 0.7 10*3/uL (ref 0.7–4.0)
Monocytes Absolute: 0.2 10*3/uL (ref 0.1–1.0)
Monocytes Relative: 2 % — ABNORMAL LOW (ref 3–12)
Neutrophils Relative %: 91 % — ABNORMAL HIGH (ref 43–77)

## 2011-01-16 LAB — CBC
Hemoglobin: 12.7 g/dL (ref 12.0–15.0)
MCV: 89.1 fL (ref 78.0–100.0)
RBC: 4.28 MIL/uL (ref 3.87–5.11)
WBC: 9.4 10*3/uL (ref 4.0–10.5)

## 2011-01-16 LAB — LIPASE, BLOOD: Lipase: 27 U/L (ref 11–59)

## 2011-01-23 LAB — COMPREHENSIVE METABOLIC PANEL
ALT: 19 U/L (ref 0–35)
AST: 26 U/L (ref 0–37)
BUN: 16 mg/dL (ref 6–23)
Calcium: 9.2 mg/dL (ref 8.4–10.5)
Calcium: 9.2 mg/dL (ref 8.4–10.5)
Creatinine, Ser: 0.7 mg/dL (ref 0.4–1.2)
GFR calc Af Amer: >60 mL/min (ref >60)
GFR calc non Af Amer: >60 mL/min (ref >60)
Glucose, Bld: 113 mg/dL — ABNORMAL HIGH (ref 70–99)
Sodium: 135 mEq/L (ref 135–145)
Sodium: 138 mEq/L (ref 135–145)
Total Protein: 6.4 g/dL (ref 6.0–8.3)
Total Protein: 7.9 g/dL (ref 6.0–8.3)

## 2011-01-23 LAB — BASIC METABOLIC PANEL
BUN: 2 mg/dL — ABNORMAL LOW (ref 6–23)
CO2: 22 mEq/L (ref 19–32)
CO2: 23 mEq/L (ref 19–32)
CO2: 26 mEq/L (ref 19–32)
Calcium: 8.3 mg/dL — ABNORMAL LOW (ref 8.4–10.5)
Calcium: 8.7 mg/dL (ref 8.4–10.5)
Chloride: 104 mEq/L (ref 96–112)
Chloride: 104 mEq/L (ref 96–112)
Chloride: 106 mEq/L (ref 96–112)
Creatinine, Ser: 0.7 mg/dL (ref 0.4–1.2)
GFR calc Af Amer: >60 mL/min (ref >60)
GFR calc non Af Amer: >60 mL/min (ref >60)
Glucose, Bld: 103 mg/dL — ABNORMAL HIGH (ref 70–99)
Potassium: 3.9 mEq/L (ref 3.5–5.1)
Potassium: 4.7 mEq/L (ref 3.5–5.1)
Potassium: 4.7 mEq/L (ref 3.5–5.1)
Sodium: 133 mEq/L — ABNORMAL LOW (ref 135–145)
Sodium: 134 mEq/L — ABNORMAL LOW (ref 135–145)
Sodium: 140 mEq/L (ref 135–145)

## 2011-01-23 LAB — CBC
HCT: 37.5 % (ref 36.0–46.0)
HCT: 38.8 % (ref 36.0–46.0)
HCT: 40 % (ref 36.0–46.0)
HCT: 43.5 % (ref 36.0–46.0)
Hemoglobin: 12.9 g/dL (ref 12.0–15.0)
Hemoglobin: 13.3 g/dL (ref 12.0–15.0)
Hemoglobin: 14.1 g/dL (ref 12.0–15.0)
Hemoglobin: 14.9 g/dL (ref 12.0–15.0)
MCHC: 33.3 g/dL (ref 30.0–36.0)
MCHC: 33.3 g/dL (ref 30.0–36.0)
MCHC: 33.5 g/dL (ref 30.0–36.0)
MCHC: 33.7 g/dL (ref 30.0–36.0)
MCHC: 34.4 g/dL (ref 30.0–36.0)
MCV: 90.7 fL (ref 78.0–100.0)
MCV: 91.5 fL (ref 78.0–100.0)
MCV: 91.6 fL (ref 78.0–100.0)
Platelets: 262 10*3/uL (ref 150–400)
Platelets: 276 10*3/uL (ref 150–400)
Platelets: 302 10*3/uL (ref 150–400)
RBC: 4.09 MIL/uL (ref 3.87–5.11)
RBC: 4.37 MIL/uL (ref 3.87–5.11)
RBC: 4.61 MIL/uL (ref 3.87–5.11)
RDW: 12.8 % (ref 11.5–15.5)
RDW: 13.1 % (ref 11.5–15.5)
RDW: 13.7 % (ref 11.5–15.5)
WBC: 8.2 10*3/uL (ref 4.0–10.5)
WBC: 8.5 10*3/uL (ref 4.0–10.5)
WBC: 8.5 10*3/uL (ref 4.0–10.5)

## 2011-01-23 LAB — TSH: TSH: 4.762 u[IU]/mL — ABNORMAL HIGH (ref 0.350–4.500)

## 2011-01-23 LAB — URINALYSIS, ROUTINE W REFLEX MICROSCOPIC
Bilirubin Urine: NEGATIVE
Nitrite: NEGATIVE
Specific Gravity, Urine: 1.034 — ABNORMAL HIGH (ref 1.005–1.030)
Urobilinogen, UA: 0.2 mg/dL (ref 0.0–1.0)

## 2011-01-23 LAB — DIFFERENTIAL
Lymphocytes Relative: 11 % — ABNORMAL LOW (ref 12–46)
Lymphs Abs: 1 10*3/uL (ref 0.7–4.0)
Monocytes Relative: 8 % (ref 3–12)
Neutro Abs: 6.9 10*3/uL (ref 1.7–7.7)
Neutrophils Relative %: 80 % — ABNORMAL HIGH (ref 43–77)

## 2011-01-23 LAB — URINE MICROSCOPIC-ADD ON

## 2011-01-23 LAB — MAGNESIUM: Magnesium: 2.5 mg/dL (ref 1.5–2.5)

## 2011-01-23 LAB — T4, FREE: Free T4: 1.54 ng/dL (ref 0.80–1.80)

## 2011-03-14 NOTE — Consult Note (Signed)
NEW PATIENT CONSULTATION   Karen Mccarthy, Karen Mccarthy  DOB:  Jun 13, 1951                                       01/24/2008  AOZHY#:[id number]   HISTORY:  The patient presents today for evaluation of lower extremity  venous pathology.  She is a healthy 60 year old white female with  progressive changes of discomfort over her lower extremities.  She works  as a Runner, broadcasting/film/video and has difficulty with a heavy sensation, aching in her  calves which has been progressive over the last 6 months.  She reports  this is more so on her right than the left leg.  She does have some mild  swelling at the end of the day.  She does not have any history of deep  venous thrombosis, ulceration or bleeding.   PAST MEDICAL HISTORY:  Significant for irritable bowel syndrome, mitral  valve prolapse, hypothyroidism.   PAST SURGICAL HISTORY:  Significant for anal fissure repair.   SOCIAL HISTORY:  She is single.  She works as a Runner, broadcasting/film/video.  She does not  smoke and has occasional once or twice a week alcohol consumption.   REVIEW OF SYSTEMS:  Positive for weight gain.  She does weigh 137  pounds.  CARDIAC:  Mitral valve prolapse only.  PULMONARY:  Does have difficulty with diarrhea and constipation.  GU:  Does have urinary frequency.  ORTHO:  She does have arthritis and joint pain.   ALLERGIES:  Penicillin and Flagyl.   MEDICATIONS:  Synthroid, Valtrex, Lexapro, p.r.n. Ambien, Klonopin and  multiple vitamin.   PHYSICAL EXAMINATION:  A well-developed, well-nourished white female  appearing stated age of 25.  She does have scattered telangiectasia over  both lower extremities.  She underwent handheld duplex by me and this  does not show any marked enlargement in her saphenous vein.  She does  have what appears to be mild reflux at her right great saphenous vein  but I cannot demonstrate a reflux in her left great saphenous vein.   I have recommended that we proceed with a formal duplex for further  evaluation.  She had seen an outlying vein clinic in the past and had  had recommendation for laser ablation.  I had a long discussion with the  patient explaining that she does not have any serious issues with her  veins that would preclude her to more serious complications such as deep  venous thrombosis or ulceration.  She does have calf discomfort.  If she  has significant reflux this may be attributable to great saphenous vein  reflux although I am certainly not convinced this is the case on her  screening ultrasound by me.  We will see her back with a formal duplex  evaluation for further discussion.   Larina Earthly, M.D.  Electronically Signed   TFE/MEDQ  D:  01/24/2008  T:  01/27/2008  Job:  [id number]   cc:   Juline Patch, M.D.

## 2011-03-17 NOTE — Assessment & Plan Note (Signed)
[facility]                           GASTROENTEROLOGY OFFICE NOTE   NAME:INSCOEAhri, Olson ANN                     MRN:          [id number]  DATE:08/15/2006                            DOB:          1951-04-20    Ms. Karen Mccarthy is a 60 year old white female with severe irritable bowel  syndrome with alternating diarrhea and constipation.  She has a history of  anal fissure, which was surgically repaired by Dr. Kendrick Ranch in 2000.  She  also had a thrombosed hemorrhoid earlier this year in April when I examined  her.  With conservative management and treatment of constipation her  symptoms went away, but about a week ago she had a recurrence of the rectal  pain.   MEDICATIONS:  1. Lexapro 5 mg q. day.  2. Vitamin C.  3. Magnesium.  4. Flax seed oil.  5. Valtrex 0.5 mg q. day.  6. Lorazepam 0.5 mg p.r.n.  7. Calmax.  8. Levoxyl.  9. AnaMantle cream.   PHYSICAL EXAM:  Blood pressure 112/70, pulse 80, and weight 126 pounds.  She was rather anxious, nervous lady.  LUNGS:  Clear to auscultation.  COR:  Normal S1 and S2.  ABDOMEN:  Soft, nontender with normoactive bowel sounds.  No distension.  No  tympany.  Bowel sounds were actually somewhat hyperactive.  RECTAL:  Anoscopic exam with a thrombosed hemorrhoid at the orifice at 6  o'clock at the area of the previous anal fissure.  It was much smaller this  time than on the exam in April.  This time it measured about 6 to 7 mm in  diameter.  It was very tender, but there was no active fissure, no bleeding,  stool was heme-negative.   IMPRESSION:  1. Recurrent thrombosed hemorrhoid status post prior repair of anal      fissure.  2. Severe irritable bowel syndrome.   PLAN:  1. Referral to Dr. Kendrick Ranch for possible banding of the hemorrhoid.  2. Resume Metamucil psyllium on daily basis.  3. Samples of Align probiotic given to take on daily basis.  4. Continue on AnaMantle cream and Anusol suppositories  with Sitz baths      until she can see Dr. Earlene Plater.       Hedwig Morton. Juanda Chance, MD      DMB/MedQ  DD:  08/15/2006  DT:  08/16/2006  Job #:  [id number]   cc:   Juline Patch, M.D.

## 2011-06-06 ENCOUNTER — Other Ambulatory Visit: Payer: Self-pay | Admitting: Obstetrics & Gynecology

## 2011-06-06 DIAGNOSIS — Z1231 Encounter for screening mammogram for malignant neoplasm of breast: Secondary | ICD-10-CM

## 2011-07-10 ENCOUNTER — Ambulatory Visit
Admission: RE | Admit: 2011-07-10 | Discharge: 2011-07-10 | Disposition: A | Discharge status: Home / Self Care | Payer: BC Managed Care – PPO | Source: Ambulatory Visit | Attending: Obstetrics & Gynecology | Admitting: Obstetrics & Gynecology

## 2011-07-10 DIAGNOSIS — Z1231 Encounter for screening mammogram for malignant neoplasm of breast: Secondary | ICD-10-CM

## 2011-12-06 ENCOUNTER — Telehealth: Payer: Self-pay | Admitting: *Deleted

## 2011-12-06 NOTE — Telephone Encounter (Signed)
Spoke with Patient, she does not want to schedule at this time will call back to schedule after her yearly physical

## 2011-12-06 NOTE — Telephone Encounter (Signed)
Patient is overdue for colonoscopy as of 04/2010. Her last colonoscopy was completed 04/08/03. It showed thick folds in the left colon consistent with prediverticular changes. Patient also has a history of colitis (by ER report in 2011) and her sister has colitis. I have left a voicemail for patient to call back.

## 2012-01-11 ENCOUNTER — Other Ambulatory Visit: Payer: Self-pay | Admitting: Obstetrics & Gynecology

## 2012-01-11 DIAGNOSIS — N63 Unspecified lump in unspecified breast: Secondary | ICD-10-CM

## 2012-01-16 ENCOUNTER — Ambulatory Visit
Admission: RE | Admit: 2012-01-16 | Discharge: 2012-01-16 | Disposition: A | Discharge status: Home / Self Care | Payer: BC Managed Care – PPO | Source: Ambulatory Visit | Attending: Obstetrics & Gynecology | Admitting: Obstetrics & Gynecology

## 2012-01-16 DIAGNOSIS — N63 Unspecified lump in unspecified breast: Secondary | ICD-10-CM

## 2012-06-10 ENCOUNTER — Other Ambulatory Visit: Payer: Self-pay | Admitting: Obstetrics & Gynecology

## 2012-06-10 DIAGNOSIS — Z1231 Encounter for screening mammogram for malignant neoplasm of breast: Secondary | ICD-10-CM

## 2012-07-08 ENCOUNTER — Telehealth: Payer: Self-pay | Admitting: Internal Medicine

## 2012-07-08 NOTE — Telephone Encounter (Signed)
OK to switch 

## 2012-07-08 NOTE — Telephone Encounter (Signed)
Ok w/ me 

## 2012-07-08 NOTE — Telephone Encounter (Signed)
Patient is asking to switch to Dr. Marina Goodell. Is this ok with you Dr. Juanda Chance?

## 2012-07-08 NOTE — Telephone Encounter (Signed)
Dr. Marina Goodell patient wants to switch to you. Will you accept this patient?

## 2012-07-24 ENCOUNTER — Ambulatory Visit
Admission: RE | Admit: 2012-07-24 | Discharge: 2012-07-24 | Disposition: A | Discharge status: Home / Self Care | Payer: BC Managed Care – PPO | Source: Ambulatory Visit | Attending: Obstetrics & Gynecology | Admitting: Obstetrics & Gynecology

## 2012-07-24 ENCOUNTER — Encounter: Payer: Self-pay | Admitting: Internal Medicine

## 2012-07-24 DIAGNOSIS — Z1231 Encounter for screening mammogram for malignant neoplasm of breast: Secondary | ICD-10-CM

## 2012-08-07 ENCOUNTER — Other Ambulatory Visit: Payer: Self-pay | Admitting: Obstetrics & Gynecology

## 2012-08-07 DIAGNOSIS — N6320 Unspecified lump in the left breast, unspecified quadrant: Secondary | ICD-10-CM

## 2012-08-12 ENCOUNTER — Ambulatory Visit
Admission: RE | Admit: 2012-08-12 | Discharge: 2012-08-12 | Disposition: A | Discharge status: Home / Self Care | Payer: BC Managed Care – PPO | Source: Ambulatory Visit | Attending: Obstetrics & Gynecology | Admitting: Obstetrics & Gynecology

## 2012-08-12 DIAGNOSIS — N6320 Unspecified lump in the left breast, unspecified quadrant: Secondary | ICD-10-CM

## 2012-08-28 ENCOUNTER — Ambulatory Visit (INDEPENDENT_AMBULATORY_CARE_PROVIDER_SITE_OTHER): Payer: BC Managed Care – PPO | Admitting: Internal Medicine

## 2012-08-28 ENCOUNTER — Encounter: Payer: Self-pay | Admitting: Internal Medicine

## 2012-08-28 VITALS — BP 110/70 | HR 60 | Ht 61.5 in | Wt 116.8 lb

## 2012-08-28 DIAGNOSIS — Z1211 Encounter for screening for malignant neoplasm of colon: Secondary | ICD-10-CM

## 2012-08-28 DIAGNOSIS — K589 Irritable bowel syndrome without diarrhea: Secondary | ICD-10-CM

## 2012-08-28 DIAGNOSIS — K5900 Constipation, unspecified: Secondary | ICD-10-CM

## 2012-08-28 DIAGNOSIS — R933 Abnormal findings on diagnostic imaging of other parts of digestive tract: Secondary | ICD-10-CM

## 2012-08-28 MED ORDER — METOCLOPRAMIDE HCL 10 MG PO TABS: ORAL_TABLET | ORAL | Status: DC

## 2012-08-28 MED ORDER — MOVIPREP 100 G PO SOLR: 1.0000 | Freq: Once | ORAL | Status: DC

## 2012-08-28 NOTE — Patient Instructions (Addendum)
You have been scheduled for a colonoscopy with propofol. Please follow written instructions given to you at your visit today.  Please pick up your prep kit at the pharmacy within the next 1-3 days. If you use inhalers (even only as needed) or a CPAP machine, please bring them with you on the day of your procedure.   We have sent the following medications to your pharmacy for you to pick up at your convenience:  Reglan.  You will take one tablet 20 minutes before drinking each prep

## 2012-09-04 ENCOUNTER — Encounter: Payer: Self-pay | Admitting: Internal Medicine

## 2012-09-04 NOTE — Progress Notes (Signed)
HISTORY OF PRESENT ILLNESS:  Karen Mccarthy is a 61 y.o. female with the below listed medical history who presents today regarding prior problems with partial small bowel obstruction and the need for colonoscopy. The patient previously underwent routine colonoscopy in 2004 without significant abnormalities. She of problems in March and May of 2011 with abdominal pain that required hospitalization. Abdominal imaging suggested partial small bowel structures. CT scan in May revealed mild stranding of the small bowel mesentery as well as left colon. She was being followed by Dr. Juanda Chance routinely. Colonoscopy is recommended. Apparently the patient had a bad previous experience and did not follow through. She wished to see me, as it seen her in the hospital. She has had a chronic history of constipation. She treats herself with magnesium supplement and occasional enemas. Problems have been better more recently. No prior history of abnormal surgery. She will not allow rectal exams stating that they're too painful. She has significant anxiety issues. She is concerns regarding perforation of the colon from colonoscopy. Multiple questions.  REVIEW OF SYSTEMS:  All non-GI ROS negative except for anxiety and depression  Past Medical History  Diagnosis Date  . Hemorrhoids   . MVP (mitral valve prolapse)   . Hypothyroidism   . Small bowel obstruction   . Anal fissure   . IBS (irritable bowel syndrome)   . Arthritis   . Depression     Past Surgical History  Procedure Date  . Anal fissure repair   . Abortion d & c   . Cystoscopy w/ spincterotomy 2000  . Mouth surgery 2012, 2013    several    Social History Karen Mccarthy  reports that she has never smoked. She has never used smokeless tobacco. She reports that she drinks alcohol. She reports that she does not use illicit drugs.  family history includes Anal fissures in her sister; Breast cancer in her paternal aunt; Cancer in her paternal aunt;  Colitis in an unspecified family member; Diverticulitis in her maternal aunt; Heart attack in her maternal grandfather and paternal grandfather; Heart disease in her father; Irritable bowel syndrome in her maternal aunt; Prostate cancer in her father; Skin cancer in her brother and father; and Throat cancer in her father.  Allergies  Allergen Reactions  . Ampicillin   . Metronidazole   . Penicillins   . Zelnorm (Tegaserod)        PHYSICAL EXAMINATION: Vital signs: BP 110/70  Pulse 60  Ht 5' 1.5" (1.562 m)  Wt 116 lb 12.8 oz (52.98 kg)  BMI 21.71 kg/m2  Constitutional: generally well-appearing, no acute distress Psychiatric: alert and oriented x3, cooperative Eyes: extraocular movements intact, anicteric, conjunctiva pink Mouth: oral pharynx moist, no lesions Neck: supple no lymphadenopathy Cardiovascular: heart regular rate and rhythm, no murmur Lungs: clear to auscultation bilaterally Abdomen: soft, nontender, nondistended, no obvious ascites, no peritoneal signs, normal bowel sounds, no organomegaly Rectal:deferred until colonoscopy Extremities: no lower extremity edema bilaterally Skin: no lesions on visible extremities Neuro: No focal deficits.   ASSESSMENT:  #1. Prior problems with abdominal pain and imaging studies suggesting partial small bowel obstruction. No problems in greater than 2 years. Minimal mesenteric and pericolonic stranding on CT. Etiology unclear #2. Screening colonoscopy. About 10 years since her last exam. Reasonable to pursue this time or repeat neoplasia screening as well as to assess regarding previous questionable abnormalities. However, screening as the principal indication #3. Chronic constipation #4. Anxiety/depression  PLAN:  #1. Colonoscopy.The nature of the procedure, as  well as the risks, benefits, and alternatives were carefully and thoroughly reviewed with the patient. After time was spent discussing perforation. I reviewed overall accepted  rates as well as my personal statistics. Ample time for discussion and questions allowed. The patient understood, was satisfied, and agreed to proceed. Movi prep prescribed. The patient instructed on its use. Propofol sedation under CRNA supervision. #2. Continue occur about regimen as this is effective for her

## 2012-10-10 ENCOUNTER — Encounter: Payer: BC Managed Care – PPO | Admitting: Internal Medicine

## 2012-11-14 ENCOUNTER — Encounter: Payer: BC Managed Care – PPO | Admitting: Internal Medicine

## 2012-11-21 ENCOUNTER — Encounter: Payer: Self-pay | Admitting: Internal Medicine

## 2012-11-21 ENCOUNTER — Ambulatory Visit (AMBULATORY_SURGERY_CENTER): Payer: BC Managed Care – PPO | Admitting: *Deleted

## 2012-11-21 VITALS — Ht 61.25 in | Wt 120.0 lb

## 2012-11-21 DIAGNOSIS — Z1211 Encounter for screening for malignant neoplasm of colon: Secondary | ICD-10-CM

## 2012-11-21 DIAGNOSIS — K5900 Constipation, unspecified: Secondary | ICD-10-CM

## 2012-11-21 DIAGNOSIS — K589 Irritable bowel syndrome without diarrhea: Secondary | ICD-10-CM

## 2012-11-21 NOTE — Progress Notes (Signed)
No egg or soy allergy. ewm  No problems with sedation or intubations in the past.  Pt states her lower  front teeth are splinted together adn states would need caution with intubation if needed, ewm

## 2012-12-04 ENCOUNTER — Ambulatory Visit (AMBULATORY_SURGERY_CENTER): Payer: BC Managed Care – PPO | Admitting: Internal Medicine

## 2012-12-04 ENCOUNTER — Telehealth: Payer: Self-pay | Admitting: Internal Medicine

## 2012-12-04 ENCOUNTER — Encounter: Payer: Self-pay | Admitting: Internal Medicine

## 2012-12-04 VITALS — BP 105/75 | HR 74 | Temp 97.4°F | Resp 17 | Ht 61.0 in | Wt 120.0 lb

## 2012-12-04 DIAGNOSIS — Z1211 Encounter for screening for malignant neoplasm of colon: Secondary | ICD-10-CM

## 2012-12-04 MED ORDER — SODIUM CHLORIDE 0.9 % IV SOLN: 500.0000 mL | INTRAVENOUS | Status: DC

## 2012-12-04 NOTE — Patient Instructions (Addendum)
Colonoscopy:  Normal Recommendations:  Repeat colonoscopy in 10 years.  YOU HAD AN ENDOSCOPIC PROCEDURE TODAY AT THE [facility]: Refer to the procedure report that was given to you for any specific questions about what was found during the examination.  If the procedure report does not answer your questions, please call your gastroenterologist to clarify.  If you requested that your care partner not be given the details of your procedure findings, then the procedure report has been included in a sealed envelope for you to review at your convenience later.  YOU SHOULD EXPECT: Some feelings of bloating in the abdomen. Passage of more gas than usual.  Walking can help get rid of the air that was put into your GI tract during the procedure and reduce the bloating. If you had a lower endoscopy (such as a colonoscopy or flexible sigmoidoscopy) you may notice spotting of blood in your stool or on the toilet paper. If you underwent a bowel prep for your procedure, then you may not have a normal bowel movement for a few days.  DIET: Your first meal following the procedure should be a light meal and then it is ok to progress to your normal diet.  A half-sandwich or bowl of soup is an example of a good first meal.  Heavy or fried foods are harder to digest and may make you feel nauseous or bloated.  Likewise meals heavy in dairy and vegetables can cause extra gas to form and this can also increase the bloating.  Drink plenty of fluids but you should avoid alcoholic beverages for 24 hours.  ACTIVITY: Your care partner should take you home directly after the procedure.  You should plan to take it easy, moving slowly for the rest of the day.  You can resume normal activity the day after the procedure however you should NOT DRIVE or use heavy machinery for 24 hours (because of the sedation medicines used during the test).    SYMPTOMS TO REPORT IMMEDIATELY: A gastroenterologist can be reached at any hour.   During normal business hours, 8:30 AM to 5:00 PM Monday through Friday, call [phone].  After hours and on weekends, please call the GI answering service at [phone] who will take a message and have the physician on call contact you.   Following lower endoscopy (colonoscopy or flexible sigmoidoscopy):  Excessive amounts of blood in the stool  Significant tenderness or worsening of abdominal pains  Swelling of the abdomen that is new, acute  Fever of 100F or higher  Following upper endoscopy (EGD)  Vomiting of blood or coffee ground material  New chest pain or pain under the shoulder blades  Painful or persistently difficult swallowing  New shortness of breath  Fever of 100F or higher  Black, tarry-looking stools  FOLLOW UP: If any biopsies were taken you will be contacted by phone or by letter within the next 1-3 weeks.  Call your gastroenterologist if you have not heard about the biopsies in 3 weeks.  Our staff will call the home number listed on your records the next business day following your procedure to check on you and address any questions or concerns that you may have at that time regarding the information given to you following your procedure. This is a courtesy call and so if there is no answer at the home number and we have not heard from you through the emergency physician on call, we will assume that you have returned to your  regular daily activities without incident.  SIGNATURES/CONFIDENTIALITY: You and/or your care partner have signed paperwork which will be entered into your electronic medical record.  These signatures attest to the fact that that the information above on your After Visit Summary has been reviewed and is understood.  Full responsibility of the confidentiality of this discharge information lies with you and/or your care-partner.   Please follow all discharge instructions given to you by the recovery room nurse. If you have any questions or  problems after discharge please call one of the numbers listed above. You will receive a phone call in the am to see how you are doing and answer any questions you may have. Thank you for choosing [facility] for your health care needs.

## 2012-12-04 NOTE — Progress Notes (Signed)
Patient did not experience any of the following events: a burn prior to discharge; a fall within the facility; wrong site/side/patient/procedure/implant event; or a hospital transfer or hospital admission upon discharge from the facility. (G8907) Patient did not have preoperative order for IV antibiotic SSI prophylaxis. (G8918)  

## 2012-12-04 NOTE — Op Note (Signed)
[facility] [address] [place], [address]   COLONOSCOPY PROCEDURE REPORT  PATIENT: Karen Mccarthy, Karen Mccarthy  MR#: [id number] BIRTHDATE: 02/14/1951 , 61  yrs. old GENDER: Female ENDOSCOPIST: Roxy Cedar, MD REFERRED BY:.  Self / Office PROCEDURE DATE:  12/04/2012 PROCEDURE:   Colonoscopy, screening ASA CLASS:   Class II INDICATIONS:Average risk patient for colon cancer.   Negative index exam 2004 MEDICATIONS: MAC sedation, administered by CRNA and propofol (Diprivan) 300mg  IV  DESCRIPTION OF PROCEDURE:   After the risks benefits and alternatives of the procedure were thoroughly explained, informed consent was obtained.  A digital rectal exam revealed an anal stricture.   The LB [id number]  endoscope was introduced through the anus and advanced to the cecum, which was identified by both the appendix and ileocecal valve. No adverse events experienced.   The quality of the prep was good, using MoviPrep The instrument was then slowly withdrawn as the colon was fully examined.      COLON FINDINGS: A normal appearing cecum, ileocecal valve, and appendiceal orifice were identified.  The ascending, hepatic flexure, transverse, splenic flexure, descending, sigmoid colon and rectum appeared unremarkable.  No polyps or cancers were seen. Retroflexed views revealed internal hemorrhoids. The time to cecum=6 minutes 25 seconds.  Withdrawal time=9 minutes 39 seconds. The scope was withdrawn and the procedure completed.  COMPLICATIONS: There were no complications.  ENDOSCOPIC IMPRESSION: 1. Normal colon  RECOMMENDATIONS: 1. Continue current colorectal screening recommendations for "routine risk" patients with a repeat colonoscopy in 10 years.   eSigned:  Roxy Cedar, MD 12/04/2012 12:27 PM   cc: Juline Patch, MD and The Patient

## 2012-12-04 NOTE — Telephone Encounter (Signed)
"  Only had 3 liquid bowel movements with first half of prep. This is not what I expected and I have had a partial obstruction before. Is it safe to take second half of prep? "  + borborygmi. No diistetntion, pain, nausea or vomiting. "Less urination than I expected".  I recommended she start and hopefully complete second half of colon prep - stop if problems and call back prn.

## 2012-12-05 ENCOUNTER — Telehealth: Payer: Self-pay

## 2012-12-05 NOTE — Telephone Encounter (Signed)
  Follow up Call-  Call back number 12/04/2012  Post procedure Call Back phone  # [phone]  Permission to leave phone message Yes     Patient questions:  Do you have a fever, pain , or abdominal swelling? no Pain Score  0 *  Have you tolerated food without any problems? yes  Have you been able to return to your normal activities? yes  Do you have any questions about your discharge instructions: Diet   no Medications  no Follow up visit  no  Do you have questions or concerns about your Care? no  Actions: * If pain score is 4 or above: No action needed, pain <4.

## 2013-03-31 ENCOUNTER — Telehealth: Payer: Self-pay | Admitting: Internal Medicine

## 2013-03-31 NOTE — Telephone Encounter (Signed)
Pt states she passed a very hard BM today and was not able to get it all out. Pt wanted to know if she should take some mineral oil. Discussed with pt that she could take Miralax and drink plenty of fluids. Pt also states she has an anal stricture that makes it harder for her to have a BM. Pt also asked about a mineral oil enema, encouraged pt to try miralax and to keep her scheduled appt. Pt verbalized understanding.

## 2013-04-02 ENCOUNTER — Ambulatory Visit (INDEPENDENT_AMBULATORY_CARE_PROVIDER_SITE_OTHER): Payer: BC Managed Care – PPO | Admitting: Internal Medicine

## 2013-04-02 ENCOUNTER — Encounter: Payer: Self-pay | Admitting: Internal Medicine

## 2013-04-02 VITALS — BP 120/60 | HR 74 | Ht 61.0 in | Wt 119.0 lb

## 2013-04-02 DIAGNOSIS — K624 Stenosis of anus and rectum: Secondary | ICD-10-CM

## 2013-04-02 DIAGNOSIS — K5900 Constipation, unspecified: Secondary | ICD-10-CM

## 2013-04-02 MED ORDER — HYDROCORTISONE ACETATE 25 MG RE SUPP: 25.0000 mg | Freq: Every evening | RECTAL | Status: DC | PRN

## 2013-04-02 NOTE — Progress Notes (Signed)
HISTORY OF PRESENT ILLNESS:  Karen Mccarthy is a 62 y.o. female with the below listed medical history who presents today with recent obstipation. Patient has a history of irritable bowel syndrome. She also has a history of an anal fissure for which she underwent lateral sphincterotomy in 2000. I assumed her GI care last year. See the 08/28/2012 dictation. On 12/04/2012 she underwent screening colonoscopy (negative index exam in 2004). The examination was entirely normal. However, she was noted to have significant anal stricture. Patient reports to me that she developed a "bowel impaction" 2 days ago. She noticed previously somewhat hard stool. Subsequently, unable to evacuate stool. Pasty stool caused significant pain. For relief she is a combination of enemas and manual maneuvers. She uses manual maneuvers intermittently to assist with defecation. She can have this problem every year or two. She is anxious emotional. Her regular bowel regimen includes Colace 400 mg daily, magnesium supplement, and high-fiber diet with plenty of fluids. She was given Zelnorm in the past with side effects. She has tried MiraLax, but has been some time. Unsure of results. She mentions that her "hemorrhoids" or uncomfortable. No bleeding.  REVIEW OF SYSTEMS:  All non-GI ROS negative except for sinus and allergy trouble, anxiety, arthritis, visual change, swelling of feet, increased urination frequency  Past Medical History  Diagnosis Date  . Hemorrhoids   . MVP (mitral valve prolapse)   . Hypothyroidism   . Small bowel obstruction   . Anal fissure   . IBS (irritable bowel syndrome)   . Arthritis   . Depression     Past Surgical History  Procedure Laterality Date  . Anal fissure repair    . Abortion d & c    . Cystoscopy w/ spincterotomy  2000  . Mouth surgery  2012, 2013    several  . Colonoscopy      Social History Karen Mccarthy  reports that she has never smoked. She has never used smokeless  tobacco. She reports that  drinks alcohol. She reports that she uses illicit drugs (Marijuana).  family history includes Anal fissures in her sister; Breast cancer in her paternal aunt; Cancer in her paternal aunt; Colitis in an unspecified family member; Diverticulitis in her maternal aunt; Heart attack in her maternal grandfather and paternal grandfather; Heart disease in her father; Irritable bowel syndrome in her maternal aunt; Prostate cancer in her father; Rectal cancer in her cousin; Skin cancer in her brother and father; and Throat cancer in her father.  There is no history of Colon cancer and Stomach cancer.  Allergies  Allergen Reactions  . Zelnorm (Tegaserod) Other (See Comments)    paralized colon- is off the market  . Ampicillin Rash    diarrhea  . Metronidazole Rash    Diarrhea, burning on skin  . Penicillins Rash    Diarrhea, burning on skin       PHYSICAL EXAMINATION:Vital signs: BP 120/60  Pulse 74  Ht 5\' 1"  (1.549 m)  Wt 119 lb (53.978 kg)  BMI 22.5 kg/m2  Constitutional: generally well-appearing, no acute distress Psychiatric: alert and oriented x3, cooperative Eyes: extraocular movements intact, anicteric, conjunctiva pink Mouth: oral pharynx moist, no lesions Neck: supple no lymphadenopathy Cardiovascular: heart regular rate and rhythm, no murmur Lungs: clear to auscultation bilaterally Abdomen: soft, nontender, nondistended, no obvious ascites, no peritoneal signs, normal bowel sounds, no organomegaly Rectal: Patient declined. Extremities: no lower extremity edema bilaterally Skin: no lesions on visible extremities Neuro: No focal deficits.  ASSESSMENT:  #1. Anal stricture secondary to prior surgery #2. Tendency toward constipation. Difficulties evacuating stool at times #3. History of irritable bowel #4. Normal colonoscopy February 2014 #5. Anxiety    PLAN:  #1. Continue Colace, magnesium, and dietary regimen #2. Add MiraLax one capful daily.  Titrated to have one to 2 bowel movements per day #3. Anusol suppositories as needed #4. Could consider balloon dilation of the sphincter. This may afford some relief. We discussed this. She prefers going with about regimen at this time #5. Repeat colonoscopy screening in 10 years

## 2013-04-02 NOTE — Patient Instructions (Addendum)
We have sent the following medications to your pharmacy for you to pick up at your convenience:  Anusol suppositories  Add Miralax to your bowel regimen as discussed with Dr. Marina Goodell.  Please follow up as needed

## 2013-06-09 ENCOUNTER — Other Ambulatory Visit: Payer: Self-pay

## 2013-06-09 DIAGNOSIS — Z1231 Encounter for screening mammogram for malignant neoplasm of breast: Secondary | ICD-10-CM

## 2013-07-30 ENCOUNTER — Ambulatory Visit: Payer: BC Managed Care – PPO

## 2013-08-20 ENCOUNTER — Ambulatory Visit
Admission: RE | Admit: 2013-08-20 | Discharge: 2013-08-20 | Disposition: A | Discharge status: Home / Self Care | Payer: BC Managed Care – PPO | Source: Ambulatory Visit

## 2013-08-20 DIAGNOSIS — Z1231 Encounter for screening mammogram for malignant neoplasm of breast: Secondary | ICD-10-CM

## 2014-04-15 ENCOUNTER — Other Ambulatory Visit: Payer: Self-pay | Admitting: Internal Medicine

## 2014-04-15 DIAGNOSIS — I83819 Varicose veins of unspecified lower extremities with pain: Secondary | ICD-10-CM

## 2014-04-21 ENCOUNTER — Ambulatory Visit
Admission: RE | Admit: 2014-04-21 | Discharge: 2014-04-21 | Disposition: A | Discharge status: Home / Self Care | Payer: 59 | Source: Ambulatory Visit | Attending: Internal Medicine | Admitting: Internal Medicine

## 2014-04-21 ENCOUNTER — Ambulatory Visit
Admission: RE | Admit: 2014-04-21 | Discharge: 2014-04-21 | Disposition: A | Discharge status: Home / Self Care | Payer: BC Managed Care – PPO | Source: Ambulatory Visit | Attending: Internal Medicine | Admitting: Internal Medicine

## 2014-04-21 DIAGNOSIS — I83819 Varicose veins of unspecified lower extremities with pain: Secondary | ICD-10-CM

## 2014-07-28 ENCOUNTER — Other Ambulatory Visit: Payer: Self-pay

## 2014-07-28 DIAGNOSIS — Z1231 Encounter for screening mammogram for malignant neoplasm of breast: Secondary | ICD-10-CM

## 2014-08-25 ENCOUNTER — Ambulatory Visit
Admission: RE | Admit: 2014-08-25 | Discharge: 2014-08-25 | Disposition: A | Discharge status: Home / Self Care | Payer: 59 | Source: Ambulatory Visit

## 2014-08-25 ENCOUNTER — Encounter (INDEPENDENT_AMBULATORY_CARE_PROVIDER_SITE_OTHER): Payer: Self-pay

## 2014-08-25 DIAGNOSIS — Z1231 Encounter for screening mammogram for malignant neoplasm of breast: Secondary | ICD-10-CM

## 2015-02-01 ENCOUNTER — Telehealth: Payer: Self-pay | Admitting: Internal Medicine

## 2015-02-01 NOTE — Telephone Encounter (Signed)
Left message for pt to call back.  Pt states she has problems with constipation. Pt going out of town on the 16th and would like to be seen prior to leaving. Pt scheduled to see Doug SouJessica Zehr PA 02/08/15 @3pm . Pt aware of appt.

## 2015-02-08 ENCOUNTER — Ambulatory Visit (INDEPENDENT_AMBULATORY_CARE_PROVIDER_SITE_OTHER): Payer: Medicare Other | Admitting: Nurse Practitioner

## 2015-02-08 ENCOUNTER — Encounter: Payer: Self-pay | Admitting: Nurse Practitioner

## 2015-02-08 VITALS — BP 116/68 | HR 64 | Ht 61.25 in | Wt 120.4 lb

## 2015-02-08 DIAGNOSIS — K5900 Constipation, unspecified: Secondary | ICD-10-CM

## 2015-02-08 DIAGNOSIS — K624 Stenosis of anus and rectum: Secondary | ICD-10-CM

## 2015-02-08 NOTE — Patient Instructions (Signed)
Take Miralax 1 capful daily in water. If needed you can take twice daily. Goal is to have loose , mushy stools.  If this causes incontenence, call our office. If the Miralax doesn't work, call us.  [phone].

## 2015-02-08 NOTE — Progress Notes (Signed)
     History of Present Illness:  Patient is a 64 year old female known to Dr. Marina GoodellPerry. She has a history of a partial SBO, chronic constipation and an anal fissure, s/p lateral sphincterotomy in year 2000.   Patient had a screening colonoscopy February 2014 with findings of an anal stricture. Colon was normal. Over the years patient has tried multiple agents for her constipation including MiraLAX, Zelnorm, Colace, and magnesium.  Patient had stopped MiraLAX after reading about side effects of prolonged use. She restarted it at twice a week about 3 weeks ago. She is still taking magnesium and 4 Colace a day. Her constipation has become much worse lately. She did have some liquid stool on Saturday, possibly the result of restarting MiraLAX. Generally her stools are mushy but still nearly impossible to pass. Patient sometimes requires digital manipulation. At times patient becomes so constipated that she has abdominal distention and abdominal pain.  Current Medications, Allergies, Past Medical History, Past Surgical History, Family History and Social History were reviewed in [facility] electronic medical record.  Physical Exam: General: Pleasant, well developed , white female in no acute distress Head: Normocephalic and atraumatic Eyes:  sclerae anicteric, conjunctiva pink  Ears: Normal auditory acuity Lungs: Clear throughout to auscultation Heart: Regular rate and rhythm Abdomen: Soft, non distended, non-tender. No masses, no hepatomegaly. Normal bowel sounds Rectal: no impaction. Anal canal snug but not severely strictured. Musculoskeletal: Symmetrical with no gross deformities  Extremities: No edema  Neurological: Alert oriented x 4, grossly nonfocal Psychological:  Alert and cooperative. Normal mood and affect  Assessment and Recommendations:   64 year old female with chronic constipation. She may have slow transit but anal stricturing likely contributing factor. On DRE her anal canal  is snug but not severely strictured. Despite stools of "mushy" consistency she still has difficult time with evacuation. Will escalate bowel regimen to further liquify stools to see if this facilitates evacuation. This is a fine line as we certainly don't want to cause any seepage problems. Continue colace, increase Colace to daily dosing, continue suppositories as needed. If this plan isn't successful then patient will consider dilation of anal stricture.

## 2015-02-10 DIAGNOSIS — K624 Stenosis of anus and rectum: Secondary | ICD-10-CM | POA: Insufficient documentation

## 2015-02-10 NOTE — Progress Notes (Signed)
Agree with plans to improve bowel habits as described

## 2015-02-25 ENCOUNTER — Ambulatory Visit: Payer: Self-pay | Admitting: Internal Medicine

## 2015-04-29 ENCOUNTER — Other Ambulatory Visit: Payer: Self-pay | Admitting: Obstetrics & Gynecology

## 2015-04-29 DIAGNOSIS — R922 Inconclusive mammogram: Secondary | ICD-10-CM

## 2015-05-04 ENCOUNTER — Other Ambulatory Visit: Payer: Medicare Other

## 2015-05-04 ENCOUNTER — Other Ambulatory Visit: Payer: Self-pay | Admitting: Obstetrics and Gynecology

## 2015-05-04 DIAGNOSIS — R922 Inconclusive mammogram: Secondary | ICD-10-CM

## 2015-05-05 ENCOUNTER — Other Ambulatory Visit: Payer: Self-pay | Admitting: Obstetrics & Gynecology

## 2015-05-05 ENCOUNTER — Ambulatory Visit
Admission: RE | Admit: 2015-05-05 | Discharge: 2015-05-05 | Disposition: A | Discharge status: Home / Self Care | Payer: Medicare Other | Source: Ambulatory Visit | Attending: Obstetrics & Gynecology | Admitting: Obstetrics & Gynecology

## 2015-05-05 DIAGNOSIS — R922 Inconclusive mammogram: Secondary | ICD-10-CM

## 2015-05-13 ENCOUNTER — Ambulatory Visit
Admission: RE | Admit: 2015-05-13 | Discharge: 2015-05-13 | Disposition: A | Discharge status: Home / Self Care | Payer: Medicare Other | Source: Ambulatory Visit | Attending: Obstetrics & Gynecology | Admitting: Obstetrics & Gynecology

## 2015-05-13 DIAGNOSIS — R922 Inconclusive mammogram: Secondary | ICD-10-CM

## 2015-05-24 ENCOUNTER — Other Ambulatory Visit: Payer: Self-pay | Admitting: Obstetrics & Gynecology

## 2015-05-24 DIAGNOSIS — N6012 Diffuse cystic mastopathy of left breast: Secondary | ICD-10-CM

## 2015-11-24 ENCOUNTER — Ambulatory Visit
Admission: RE | Admit: 2015-11-24 | Discharge: 2015-11-24 | Disposition: A | Discharge status: Home / Self Care | Payer: Medicare Other | Source: Ambulatory Visit | Attending: Obstetrics & Gynecology | Admitting: Obstetrics & Gynecology

## 2015-11-24 ENCOUNTER — Other Ambulatory Visit: Payer: Self-pay | Admitting: Obstetrics & Gynecology

## 2015-11-24 DIAGNOSIS — N6012 Diffuse cystic mastopathy of left breast: Secondary | ICD-10-CM

## 2016-10-20 ENCOUNTER — Other Ambulatory Visit: Payer: Self-pay | Admitting: Obstetrics & Gynecology

## 2016-10-20 DIAGNOSIS — Z1231 Encounter for screening mammogram for malignant neoplasm of breast: Secondary | ICD-10-CM

## 2016-11-23 DIAGNOSIS — H903 Sensorineural hearing loss, bilateral: Secondary | ICD-10-CM | POA: Insufficient documentation

## 2016-11-23 DIAGNOSIS — H9313 Tinnitus, bilateral: Secondary | ICD-10-CM | POA: Insufficient documentation

## 2016-11-28 ENCOUNTER — Ambulatory Visit
Admission: RE | Admit: 2016-11-28 | Discharge: 2016-11-28 | Disposition: A | Discharge status: Home / Self Care | Payer: Medicare Other | Source: Ambulatory Visit | Attending: Obstetrics & Gynecology | Admitting: Obstetrics & Gynecology

## 2016-11-28 DIAGNOSIS — Z1231 Encounter for screening mammogram for malignant neoplasm of breast: Secondary | ICD-10-CM

## 2017-05-16 ENCOUNTER — Other Ambulatory Visit: Payer: Self-pay | Admitting: Obstetrics & Gynecology

## 2017-05-16 DIAGNOSIS — N6310 Unspecified lump in the right breast, unspecified quadrant: Secondary | ICD-10-CM

## 2017-05-21 ENCOUNTER — Ambulatory Visit
Admission: RE | Admit: 2017-05-21 | Discharge: 2017-05-21 | Disposition: A | Discharge status: Home / Self Care | Payer: Medicare Other | Source: Ambulatory Visit | Attending: Obstetrics & Gynecology | Admitting: Obstetrics & Gynecology

## 2017-05-21 DIAGNOSIS — N6310 Unspecified lump in the right breast, unspecified quadrant: Secondary | ICD-10-CM

## 2017-05-22 ENCOUNTER — Other Ambulatory Visit: Payer: Medicare Other

## 2017-10-31 ENCOUNTER — Other Ambulatory Visit: Payer: Self-pay | Admitting: Obstetrics & Gynecology

## 2017-10-31 DIAGNOSIS — Z139 Encounter for screening, unspecified: Secondary | ICD-10-CM

## 2017-11-29 ENCOUNTER — Ambulatory Visit: Payer: Medicare Other

## 2017-12-03 ENCOUNTER — Ambulatory Visit
Admission: RE | Admit: 2017-12-03 | Discharge: 2017-12-03 | Disposition: A | Discharge status: Home / Self Care | Payer: Medicare Other | Source: Ambulatory Visit | Attending: Obstetrics & Gynecology | Admitting: Obstetrics & Gynecology

## 2017-12-03 ENCOUNTER — Ambulatory Visit: Payer: Medicare Other

## 2017-12-03 DIAGNOSIS — Z139 Encounter for screening, unspecified: Secondary | ICD-10-CM

## 2017-12-11 ENCOUNTER — Ambulatory Visit: Payer: Self-pay | Admitting: Allergy and Immunology

## 2017-12-20 ENCOUNTER — Encounter: Payer: Self-pay | Admitting: Allergy and Immunology

## 2017-12-20 ENCOUNTER — Ambulatory Visit: Payer: Medicare Other | Admitting: Allergy and Immunology

## 2017-12-20 VITALS — BP 122/88 | HR 80 | Temp 98.2°F | Resp 16 | Ht 60.7 in | Wt 120.0 lb

## 2017-12-20 DIAGNOSIS — H1045 Other chronic allergic conjunctivitis: Secondary | ICD-10-CM

## 2017-12-20 DIAGNOSIS — H1013 Acute atopic conjunctivitis, bilateral: Secondary | ICD-10-CM

## 2017-12-20 DIAGNOSIS — J3089 Other allergic rhinitis: Secondary | ICD-10-CM

## 2017-12-20 MED ORDER — MONTELUKAST SODIUM 10 MG PO TABS: 10.0000 mg | ORAL_TABLET | Freq: Every day | ORAL | 5 refills | Status: DC

## 2017-12-20 MED ORDER — OLOPATADINE HCL 0.7 % OP SOLN: 1.0000 [drp] | Freq: Every day | OPHTHALMIC | 5 refills | Status: DC | PRN

## 2017-12-20 NOTE — Patient Instructions (Addendum)
  1.  Allergen avoidance measures  2.  Treat and prevent inflammation:   A.  Montelukast 10 mg tablet 1 time per day  B.  OTC Nasacort/Rhinocort 1 spray each nostril 1 time per day  3.  If needed:   A.  Cetirizine 10 mg tablet 1 time per day  B.  Pazeo 1 drop each eye one time per day  4.  Return to clinic in 4 weeks or earlier if problem

## 2017-12-20 NOTE — Progress Notes (Signed)
Dear Dr. Jennette KettleNeal,  Thank you for referring Karen AschoffSusan Ann Mccarthy to the [facility] [place] on 12/20/2017.   Below is a summation of this patient's evaluation and recommendations.  Thank you for your referral. I will keep you informed about this patient's response to treatment.   If you have any questions please do not hesitate to contact me.   Sincerely,  Jessica PriestEric J. Texie Tupou, MD Allergy / Immunology [facility]   ______________________________________________________________________    NEW PATIENT NOTE  Referring Provider: Freddy FinnerNeal, W Ronald, MD Primary Provider: Georgianne Fickamachandran, Ajith, MD Date of office visit: 12/20/2017    Subjective:   Chief Complaint:  Karen AschoffSusan Ann Mccarthy (DOB: 09/23/1951) is a 67 y.o. female who presents to the clinic on 12/20/2017 with a chief complaint of Itchy Eye and Nasal Congestion .     HPI: Karen PikesSusan presents to this clinic in evaluation of allergies.  She relates a long history of having problems with nasal congestion and sneezing without any associated anosmia or recurrent headaches or ugly nasal discharge without an obvious precipitating factor.  She has tried various antihistamines to help this issue which has not really resulted in any benefit.  She also has the sensation that she is irritated around her eyes and she has puffy eyes and itchy eyes.  She has had evaluation with an ophthalmologist for her cataract removal and was told that she does not have dry eye.  There is no obvious provoking factor giving rise to these issues.  She may be a little bit worse during the fall time season when she locates outdoors.  She does have snoring.  Sometimes she will awaken herself at night with snoring.  For the most part when she wakes up in the morning she feels refreshed as though she has slept well and she does not need to take a nap during the daytime.  Past Medical History:  Diagnosis Date    . Anal fissure   . Anal stricture   . Arthritis    In neck  . Depression   . Hemorrhoids   . Hypothyroidism   . IBS (irritable bowel syndrome)   . MVP (mitral valve prolapse)   . Osteoporosis    Right hip  . Small bowel obstruction (HCC)   . Tinnitus     Past Surgical History:  Procedure Laterality Date  . abortion d & c    . ANAL FISSURE REPAIR    . BREAST BIOPSY Left   . COLONOSCOPY    . CYSTOSCOPY W/ SPINCTEROTOMY  2000  . DENTAL SURGERY     Bone graft, extraction, implant  . MOUTH SURGERY  2012, 2013   several    Allergies as of 12/20/2017      Reactions   Zelnorm [tegaserod] Other (See Comments)   paralized colon- is off the market   Ampicillin Rash   diarrhea   Metronidazole Rash   Diarrhea, burning on skin   Penicillins Rash   Diarrhea, burning on skin      Medication List      ADVIL PO Take by mouth as needed.   ALPRAZolam 0.5 MG tablet Commonly known as:  XANAX Take 0.5 mg by mouth as needed for anxiety.   aspirin 325 MG tablet Take 325 mg by mouth daily. 2 tabs as needed for pain   buPROPion 150 MG 24 hr tablet Commonly known as:  WELLBUTRIN XL Take 150 mg  by mouth daily.   clonazePAM 0.5 MG tablet Commonly known as:  KLONOPIN Take 0.5 mg by mouth as needed.   docusate sodium 100 MG capsule Commonly known as:  COLACE Take 100 mg by mouth 2 (two) times daily. 400mg  every day, does 4 of the 100mg  and sometimes does 200mg   Twice a day   Magnesium 500 MG Caps Take 2 capsules by mouth daily.   NASAL SALINE NA Place into the nose.   progesterone 100 MG capsule Commonly known as:  PROMETRIUM Take 100 mg by mouth daily.   SENOKOT PO Take by mouth 2 (two) times daily.   SYNTHROID 88 MCG tablet Generic drug:  levothyroxine Take 88 mcg by mouth daily.   VALTREX 1000 MG tablet Generic drug:  valACYclovir Take 1,000 mg by mouth as needed.   vitamin C 1000 MG tablet Take 1,000 mg by mouth daily.   VITAMIN D PO Take by mouth.    VIVELLE-DOT TD Place onto the skin. 1 patch a week       Review of systems negative except as noted in HPI / PMHx or noted below:  Review of Systems  Constitutional: Negative.   HENT: Negative.   Eyes: Negative.   Respiratory: Negative.   Cardiovascular: Negative.   Gastrointestinal: Negative.   Genitourinary: Negative.   Musculoskeletal: Negative.   Skin: Negative.   Neurological: Negative.   Endo/Heme/Allergies: Negative.   Psychiatric/Behavioral: Negative.     Family History  Problem Relation Age of Onset  . Prostate cancer Father        ?  Marland Kitchen Heart disease Father   . Throat cancer Father        voice box removed  . Skin cancer Father   . Colitis Unknown        siblings  . Breast cancer Paternal Aunt   . Heart attack Maternal Grandfather   . Skin cancer Brother        in throat  . Cancer Paternal Aunt        vulvar  . Heart attack Paternal Grandfather   . Irritable bowel syndrome Maternal Aunt        x 3  . Diverticulitis Maternal Aunt        x 3  . Anal fissures Sister   . Rectal cancer Cousin        1st cousin  . COPD Mother   . Macular degeneration Mother   . Lung cancer Sister   . Colon cancer Neg Hx   . Stomach cancer Neg Hx     Social History   Socioeconomic History  . Marital status: Single    Spouse name: Not on file  . Number of children: 0  . Years of education: Not on file  . Highest education level: Not on file  Social Needs  . Financial resource strain: Not on file  . Food insecurity - worry: Not on file  . Food insecurity - inability: Not on file  . Transportation needs - medical: Not on file  . Transportation needs - non-medical: Not on file  Occupational History  . Occupation: Research officer, trade union  . Occupation: retired Runner, broadcasting/film/video  Tobacco Use  . Smoking status: Never Smoker  . Smokeless tobacco: Never Used  Substance and Sexual Activity  . Alcohol use: Yes    Comment: 0-2 every week  . Drug use: Yes    Types: Marijuana     Comment: marijuana occasionally  . Sexual activity: Not on file  Other  Topics Concern  . Not on file  Social History Narrative  . Not on file    Environmental and Social history  Lives in a house with a dry environment, no animals located inside the household, carpet in the bedroom, no plastic on the bed, no plastic on the pillow, and no smoking ongoing with inside the household.  Objective:   Vitals:   12/20/17 1347  BP: 122/88  Pulse: 80  Resp: 16  Temp: 98.2 F (36.8 C)   Height: 5' 0.7" (154.2 cm) Weight: 120 lb (54.4 kg)  Physical Exam  Constitutional: She is well-developed, well-nourished, and in no distress.  Allergic shiners  HENT:  Head: Normocephalic. Head is without right periorbital erythema and without left periorbital erythema.  Right Ear: Tympanic membrane, external ear and ear canal normal.  Left Ear: Tympanic membrane, external ear and ear canal normal.  Nose: Mucosal edema present. No rhinorrhea.  Mouth/Throat: Uvula is midline, oropharynx is clear and moist and mucous membranes are normal. No oropharyngeal exudate.  Eyes: Conjunctivae and lids are normal. Pupils are equal, round, and reactive to light.  Neck: Trachea normal. No tracheal tenderness present. No tracheal deviation present. No thyromegaly present.  Cardiovascular: Normal rate, regular rhythm, S1 normal, S2 normal and normal heart sounds.  No murmur heard. Pulmonary/Chest: Effort normal and breath sounds normal. No stridor. No tachypnea. No respiratory distress. She has no wheezes. She has no rales. She exhibits no tenderness.  Abdominal: Soft. She exhibits no distension and no mass. There is no hepatosplenomegaly. There is no tenderness. There is no rebound and no guarding.  Musculoskeletal: She exhibits no edema or tenderness.  Lymphadenopathy:       Head (right side): No tonsillar adenopathy present.       Head (left side): No tonsillar adenopathy present.    She has no cervical  adenopathy.    She has no axillary adenopathy.  Neurological: She is alert. Gait normal.  Skin: No rash noted. She is not diaphoretic. No erythema. No pallor. Nails show no clubbing.  Psychiatric: Mood and affect normal.    Diagnostics: Allergy skin tests were performed.  She did not demonstrate hypersensitivity to a screening panel of aeroallergens or foods  Assessment and Plan:    1. Other allergic rhinitis   2. Perennial allergic conjunctivitis of both eyes     1.  Allergen avoidance measures  2.  Treat and prevent inflammation:   A.  Montelukast 10 mg tablet 1 time per day  B.  OTC Nasacort/Rhinocort 1 spray each nostril 1 time per day  3.  If needed:   A.  Cetirizine 10 mg tablet 1 time per day  B.  Pazeo 1 drop each eye one time per day  4.  Return to clinic in 4 weeks or earlier if problem  Correen does appear to have some irritation of both her airway and her conjunctiva and although we could not identify a specific allergic agent giving rise to this issue we will still treat her at this point with a trial of a leukotriene modifier and topical nasal steroids and see how things go over the course of the next 4 weeks.  We need to consider other issues contributing to her conjunctival irritation including possible ocular rosacea if she does not respond adequately to this approach.  Jessica Priest, MD Allergy / Immunology [facility]

## 2017-12-24 ENCOUNTER — Encounter: Payer: Self-pay | Admitting: Allergy and Immunology

## 2018-01-17 ENCOUNTER — Ambulatory Visit: Payer: Medicare Other | Admitting: Allergy and Immunology

## 2018-01-22 ENCOUNTER — Encounter: Payer: Self-pay | Admitting: Allergy and Immunology

## 2018-01-22 ENCOUNTER — Ambulatory Visit: Payer: Medicare Other | Admitting: Allergy and Immunology

## 2018-01-22 VITALS — BP 110/68 | HR 72 | Resp 20

## 2018-01-22 DIAGNOSIS — H1045 Other chronic allergic conjunctivitis: Secondary | ICD-10-CM | POA: Diagnosis not present

## 2018-01-22 DIAGNOSIS — J3089 Other allergic rhinitis: Secondary | ICD-10-CM | POA: Diagnosis not present

## 2018-01-22 DIAGNOSIS — H1013 Acute atopic conjunctivitis, bilateral: Secondary | ICD-10-CM

## 2018-01-22 MED ORDER — MONTELUKAST SODIUM 10 MG PO TABS: 10.0000 mg | ORAL_TABLET | Freq: Every day | ORAL | 5 refills | Status: DC

## 2018-01-22 NOTE — Progress Notes (Signed)
Follow-up Note  Referring Provider: Freddy FinnerNeal, W Ronald, MD Primary Provider: Georgianne Fickamachandran, Ajith, MD Date of Office Visit: 01/22/2018  Subjective:   Karen AschoffSusan Ann Mccarthy (DOB: 10/23/1951) is a 67 y.o. female who [facility] 01[facility]22/2018 in re-evaluation of the following:  HPI: Karen PikesSusan returns to this clinic in reevaluation of her nasal and eye issue addressed during her initial evaluation 20 December 2017.  With treatment she believes that she is much better at this point in time.  Her eyes do not feel as puffy or as irritated and she has very little issues revolving around her nose.  She is no longer waking herself at nighttime with snoring.  Allergies as of 01/22/2018      Reactions   Zelnorm [tegaserod] Other (See Comments)   paralized colon- is off the market   Ampicillin Rash   diarrhea   Metronidazole Rash   Diarrhea, burning on skin   Penicillins Rash   Diarrhea, burning on skin      Medication List      ADVIL PO Take by mouth as needed.   ALPRAZolam 0.5 MG tablet Commonly known as:  XANAX Take 0.5 mg by mouth as needed for anxiety.   buPROPion 150 MG 24 hr tablet Commonly known as:  WELLBUTRIN XL Take 150 mg by mouth daily.   clonazePAM 0.5 MG tablet Commonly known as:  KLONOPIN Take 0.5 mg by mouth as needed.   docusate sodium 100 MG capsule Commonly known as:  COLACE Take 100 mg by mouth 2 (two) times daily. 400mg  every day, does 4 of the 100mg  and sometimes does 200mg   Twice a day   Magnesium 500 MG Caps Take 2 capsules by mouth daily.   montelukast 10 MG tablet Commonly known as:  SINGULAIR Take 1 tablet (10 mg total) by mouth at bedtime.   NASAL SALINE NA Place into the nose.   progesterone 100 MG capsule Commonly known as:  PROMETRIUM Take 100 mg by mouth daily.   SENOKOT PO Take by mouth 2 (two) times daily.   SYNTHROID 88 MCG tablet Generic drug:  levothyroxine Take 88 mcg by mouth daily.   VALTREX 1000 MG  tablet Generic drug:  valACYclovir Take 1,000 mg by mouth as needed.   vitamin C 1000 MG tablet Take 1,000 mg by mouth daily.   VITAMIN D PO Take by mouth.   VIVELLE-DOT TD Place onto the skin. 1 patch a week       Past Medical History:  Diagnosis Date  . Anal fissure   . Anal stricture   . Arthritis    In neck  . Depression   . Hemorrhoids   . Hypothyroidism   . IBS (irritable bowel syndrome)   . MVP (mitral valve prolapse)   . Osteoporosis    Right hip  . Small bowel obstruction (HCC)   . Tinnitus     Past Surgical History:  Procedure Laterality Date  . abortion d & c    . ANAL FISSURE REPAIR    . BREAST BIOPSY Left   . COLONOSCOPY    . CYSTOSCOPY W/ SPINCTEROTOMY  2000  . DENTAL SURGERY     Bone graft, extraction, implant  . MOUTH SURGERY  2012, 2013   several    Review of systems negative except as noted in HPI / PMHx or noted below:  Review of Systems  Constitutional: Negative.   HENT: Negative.   Eyes: Negative.   Respiratory:  Negative.   Cardiovascular: Negative.   Gastrointestinal: Negative.   Genitourinary: Negative.   Musculoskeletal: Negative.   Skin: Negative.   Neurological: Negative.   Endo/Heme/Allergies: Negative.   Psychiatric/Behavioral: Negative.      Objective:   Vitals:   01/22/18 1542  BP: 110/68  Pulse: 72  Resp: 20          Physical Exam  Constitutional: She is well-developed, well-nourished, and in no distress.  HENT:  Head: Normocephalic.  Right Ear: Tympanic membrane, external ear and ear canal normal.  Left Ear: Tympanic membrane, external ear and ear canal normal.  Nose: Nose normal. No mucosal edema or rhinorrhea.  Mouth/Throat: Uvula is midline, oropharynx is clear and moist and mucous membranes are normal. No oropharyngeal exudate.  Eyes: Conjunctivae are normal.  Neck: Trachea normal. No tracheal tenderness present. No tracheal deviation present. No thyromegaly present.  Cardiovascular: Normal rate,  regular rhythm, S1 normal, S2 normal and normal heart sounds.  No murmur heard. Pulmonary/Chest: Breath sounds normal. No stridor. No respiratory distress. She has no wheezes. She has no rales.  Musculoskeletal: She exhibits no edema.  Lymphadenopathy:       Head (right side): No tonsillar adenopathy present.       Head (left side): No tonsillar adenopathy present.    She has no cervical adenopathy.  Neurological: She is alert. Gait normal.  Skin: No rash noted. She is not diaphoretic. No erythema. Nails show no clubbing.  Psychiatric: Mood and affect normal.    Diagnostics: none  Assessment and Plan:   1. Other allergic rhinitis   2. Perennial allergic conjunctivitis of both eyes     1.  Continue to treat and prevent inflammation:   A.  Montelukast 10 mg tablet 1 time per day  B.  OTC Nasacort/Rhinocort 1 spray each nostril 1 time per day  2.  If needed:   A.  Cetirizine 10 mg tablet 1 time per day  B.  Pazeo 1 drop each eye one time per day  3.  Return to clinic in summer 2019 or earlier if problem  [name] to be doing quite well on her current plan and she will remain on a leukotriene modifier and a nasal steroid on a pretty consistent basis and I will see her back in this clinic in the summer 2019 or earlier if there is a problem.  [name], MD Allergy / Immunology [facility]

## 2018-01-22 NOTE — Patient Instructions (Signed)
  1.  Continue to treat and prevent inflammation:   A.  Montelukast 10 mg tablet 1 time per day  B.  OTC Nasacort/Rhinocort 1 spray each nostril 1 time per day  2.  If needed:   A.  Cetirizine 10 mg tablet 1 time per day  B.  Pazeo 1 drop each eye one time per day  3.  Return to clinic in summer 2019 or earlier if problem

## 2018-01-23 ENCOUNTER — Encounter: Payer: Self-pay | Admitting: Allergy and Immunology

## 2018-08-21 DIAGNOSIS — M25511 Pain in right shoulder: Secondary | ICD-10-CM | POA: Insufficient documentation

## 2018-08-21 DIAGNOSIS — M7541 Impingement syndrome of right shoulder: Secondary | ICD-10-CM | POA: Insufficient documentation

## 2018-09-05 ENCOUNTER — Other Ambulatory Visit (INDEPENDENT_AMBULATORY_CARE_PROVIDER_SITE_OTHER): Payer: Self-pay | Admitting: Otolaryngology

## 2018-09-05 DIAGNOSIS — J329 Chronic sinusitis, unspecified: Secondary | ICD-10-CM

## 2018-09-09 ENCOUNTER — Ambulatory Visit
Admission: RE | Admit: 2018-09-09 | Discharge: 2018-09-09 | Disposition: A | Discharge status: Home / Self Care | Payer: Medicare Other | Source: Ambulatory Visit | Attending: Otolaryngology | Admitting: Otolaryngology

## 2018-09-09 DIAGNOSIS — J329 Chronic sinusitis, unspecified: Secondary | ICD-10-CM

## 2018-10-15 ENCOUNTER — Encounter (INDEPENDENT_AMBULATORY_CARE_PROVIDER_SITE_OTHER): Payer: Self-pay | Admitting: Orthopaedic Surgery

## 2018-10-15 ENCOUNTER — Ambulatory Visit (INDEPENDENT_AMBULATORY_CARE_PROVIDER_SITE_OTHER): Payer: Medicare Other | Admitting: Orthopaedic Surgery

## 2018-10-15 DIAGNOSIS — G8929 Other chronic pain: Secondary | ICD-10-CM

## 2018-10-15 DIAGNOSIS — M25511 Pain in right shoulder: Secondary | ICD-10-CM

## 2018-10-15 NOTE — Progress Notes (Signed)
Office Visit Note   Patient: Karen CanningSusan Ann Mccarthy           Date of Birth: 01/10/1951           MRN: [id number] Visit Date: 10/15/2018              Requested by: Georgianne Fickamachandran, Ajith, MD [address] [place], [place] [id number] PCP: Georgianne Fickamachandran, Ajith, MD   Assessment & Plan: Visit Diagnoses:  1. Chronic right shoulder pain     Plan: At this point unfortunately nonsurgical treatment has failed to provide significant pain relief for Karen Mccarthy.  I reviewed the MRI images as well as the report today and we discussed at length that I agree with her previous consultations with surgeons that she is likely headed for surgery.  I think ultimately it is up to her as to when she wants to have this done.  I know she wants to go on vacation to [place] next summer which could not be impacted by the surgery.  She will likely have the surgery done in [place] because her family is nearby to provide her with postoperative assistance.  Questions encouraged and answered.  Follow-Up Instructions: Return if symptoms worsen or fail to improve.   Orders:  No orders of the defined types were placed in this encounter.  No orders of the defined types were placed in this encounter.     Procedures: No procedures performed   Clinical Data: No additional findings.   Subjective: Chief Complaint  Patient presents with  . 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  She has seen multiple shoulder doctors and she has failed extensive conservative treatment in terms of physical therapy and subacromial injections which have given her temporary and partial relief.  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  She also has a downward sloping spur from the acromion.  She is having significant  pain and dysfunction especially with raising her arm and use of the right upper extremity for ADLs.   Review of Systems  Constitutional: Negative.   HENT: Negative.   Eyes: Negative.   Respiratory: Negative.   Cardiovascular: Negative.   Endocrine: Negative.   Musculoskeletal: Negative.   Neurological: Negative.   Hematological: Negative.   Psychiatric/Behavioral: Negative.   All other systems reviewed and are negative.    Objective: Vital Signs: There were no vitals taken for this visit.  Physical Exam Vitals signs and nursing note reviewed.  Constitutional:      Appearance: She is well-developed.  HENT:     Head: Normocephalic and atraumatic.  Neck:     Musculoskeletal: Neck supple.  Pulmonary:     Effort: Pulmonary effort is normal.  Abdominal:     Palpations: Abdomen is soft.  Skin:    General: Skin is warm.     Capillary Refill: Capillary refill takes less than 2 seconds.  Neurological:     Mental Status: She is alert and oriented to person, place, and time.  Psychiatric:        Behavior: Behavior normal.        Thought Content: Thought content normal.        Judgment: Judgment normal.     Ortho Exam Right shoulder testing shows mildly weak to manual muscle testing secondary  to pain.  Negative drop arm negative empty can.  She has exquisitely positive Hawkins impingement. Specialty Comments:  No specialty comments available.  Imaging: No results found.   PMFS History: Patient Active Problem List   Diagnosis Date Noted  . Anal stricture 02/10/2015  . ABDOMINAL PAIN, UNSPECIFIED SITE 03/29/2010  . HYPOTHYROIDISM 01/26/2010  . MITRAL VALVE PROLAPSE 01/26/2010  . HEMORRHOID, THROMBOSED 01/26/2010  . Constipation 01/26/2010  . IRRITABLE BOWEL SYNDROME 01/26/2010  . Personal history of other diseases of digestive system 01/26/2010  . ANAL FISSURE, HX OF 01/26/2010   Past Medical History:  Diagnosis Date  . Anal fissure   . Anal stricture   .  Arthritis    In neck  . Depression   . Hemorrhoids   . Hypothyroidism   . IBS (irritable bowel syndrome)   . MVP (mitral valve prolapse)   . Osteoporosis    Right hip  . Small bowel obstruction (HCC)   . Tinnitus     Family History  Problem Relation Age of Onset  . Prostate cancer Father        ?  Marland Kitchen Heart disease Father   . Throat cancer Father        voice box removed  . Skin cancer Father   . Colitis Unknown        siblings  . Breast cancer Paternal Aunt   . Heart attack Maternal Grandfather   . Skin cancer Brother        in throat  . Cancer Paternal Aunt        vulvar  . Heart attack Paternal Grandfather   . Irritable bowel syndrome Maternal Aunt        x 3  . Diverticulitis Maternal Aunt        x 3  . Anal fissures Sister   . Rectal cancer Cousin        1st cousin  . COPD Mother   . Macular degeneration Mother   . Lung cancer Sister   . Colon cancer Neg Hx   . Stomach cancer Neg Hx     Past Surgical History:  Procedure Laterality Date  . abortion d & c    . ANAL FISSURE REPAIR    . BREAST BIOPSY Left   . COLONOSCOPY    . CYSTOSCOPY W/ SPINCTEROTOMY  2000  . DENTAL SURGERY     Bone graft, extraction, implant  . MOUTH SURGERY  2012, 2013   several   Social History   Occupational History  . Occupation: Research officer, trade union  . Occupation: retired Runner, broadcasting/film/video  Tobacco Use  . Smoking status: Never Smoker  . Smokeless tobacco: Never Used  Substance and Sexual Activity  . Alcohol use: Yes    Comment: 0-2 every week  . Drug use: Yes    Types: Marijuana    Comment: marijuana occasionally  . Sexual activity: Not on file

## 2018-10-29 ENCOUNTER — Other Ambulatory Visit: Payer: Self-pay | Admitting: Obstetrics & Gynecology

## 2018-10-29 DIAGNOSIS — Z1231 Encounter for screening mammogram for malignant neoplasm of breast: Secondary | ICD-10-CM

## 2018-11-15 ENCOUNTER — Ambulatory Visit: Payer: Medicare Other | Admitting: Nurse Practitioner

## 2018-11-15 ENCOUNTER — Encounter: Payer: Self-pay | Admitting: Nurse Practitioner

## 2018-11-15 VITALS — BP 128/80 | HR 72 | Ht 61.25 in | Wt 120.0 lb

## 2018-11-15 DIAGNOSIS — K5909 Other constipation: Secondary | ICD-10-CM

## 2018-11-15 MED ORDER — LUBIPROSTONE 8 MCG PO CAPS: 8.0000 ug | ORAL_CAPSULE | Freq: Two times a day (BID) | ORAL | 3 refills | Status: DC

## 2018-11-15 NOTE — Patient Instructions (Addendum)
Take Amitiza 8 mcg 1 capsule twice daily with food.  Prescription sent to [facility] [address] Samples provided.   If no improvement in a few days, call our office and ask for Paula's nurse. [phone]. Normal BMI (Body Mass Index- based on height and weight) is between 23 and 30. Your BMI today is Body mass index is 22.49 kg/m. Marland Kitchen Please consider follow up  regarding your BMI with your Primary Care Provider.

## 2018-11-15 NOTE — Progress Notes (Signed)
ASSESSMENT / PLAN:   Chronic constipation / known anal stricture.  Karen Mccarthy is concerned about potential of worsening constipation following upcoming rotator cuff surgery.  -Tried Zelnorm years ago but after just one dose Karen Mccarthy was unable to have a BM for days so has been scared to try similar medications.  -Karen Mccarthy continually shuffles between numerous OTC agents and still has sensation of incomplete evacuation.  -recommended trial of Amitiza, especially given foreseeable  use of pain meds in near future. Advised to go ahead and try Amitiza instead of waiting for post-op period. This way Karen Mccarthy will know what to expect from the medication.  -Probably will not needs OTC constipation meds along with Amitiza. Discontinuation of those agents at her discretion.   Colon cancer screening. Next colonoscopy due 2024  HPI:    Chief Complaint:   constipation   Karen Mccarthy is a 68 yo female known to Dr. Marina GoodellPerry. Karen Mccarthy has a history of a partial SBO, chronic constipation and an anal fissure on screening colonoscopy Feb 2014. Exam was otherwise unremarkable. For constipation over the years Karen Mccarthy has used multiple agents including MiraLAX, Zelnorm, Colace, senokot, and magnesium. Sometimes requires digital manipulation. Tried Zelnorm years ago but after one dose her "whole system froze" and Karen Mccarthy wasn't able to have a BM for days. Since then Karen Mccarthy has been resistant to trying any other Rx medications for constipation. Her current regimen consists of daily Mg+, Miralax and a medium chain triglyceride capsule. Still often feels incompletely evacuated. Karen Mccarthy is having rotator cuff surgery on 1/28 and wants to be sure constipation doesn't worsen post-operatively from pain meds, decreased mobility. Other than constipation Karen Mccarthy feels okay. No other GI complaints.    Past Medical History:  Diagnosis Date  . Anal fissure   . Anal stricture   . Arthritis    In neck  . Depression   . Hemorrhoids   . Hypothyroidism   .  IBS (irritable bowel syndrome)   . MVP (mitral valve prolapse)   . Osteoporosis    Right hip  . Small bowel obstruction (HCC)   . Tinnitus      Past Surgical History:  Procedure Laterality Date  . abortion d & c    . ANAL FISSURE REPAIR    . BREAST BIOPSY Left   . COLONOSCOPY    . CYSTOSCOPY W/ SPINCTEROTOMY  2000  . DENTAL SURGERY     Bone graft, extraction, implant  . MOUTH SURGERY  2012, 2013   several   Family History  Problem Relation Age of Onset  . Prostate cancer Father        ?  Marland Kitchen. Heart disease Father   . Throat cancer Father        voice box removed  . Skin cancer Father   . Colitis Other        siblings  . Breast cancer Paternal Aunt   . Heart attack Maternal Grandfather   . Skin cancer Brother        in throat  . Cancer Paternal Aunt        vulvar  . Heart attack Paternal Grandfather   . Irritable bowel syndrome Maternal Aunt        x 3  . Diverticulitis Maternal Aunt        x 3  . Anal fissures Sister   . Rectal cancer Cousin  1st cousin  . COPD Mother   . Macular degeneration Mother   . Lung cancer Sister   . Colon cancer Neg Hx   . Stomach cancer Neg Hx    Social History   Tobacco Use  . Smoking status: Never Smoker  . Smokeless tobacco: Never Used  Substance Use Topics  . Alcohol use: Yes    Comment: 0-2 every week  . Drug use: Yes    Types: Marijuana    Comment: marijuana occasionally   Current Outpatient Medications  Medication Sig Dispense Refill  . ALPRAZolam (XANAX) 0.5 MG tablet Take 0.5 mg by mouth as needed for anxiety.    . Ascorbic Acid (VITAMIN C) 1000 MG tablet Take 1,000 mg by mouth daily.    . Cholecalciferol (VITAMIN D PO) Take by mouth.    . clonazePAM (KLONOPIN) 0.5 MG tablet Take 0.5 mg by mouth as needed.    . docusate sodium (COLACE) 100 MG capsule Take 100 mg by mouth 2 (two) times daily. 400mg  every day, does 4 of the 100mg  and sometimes does 200mg   Twice a day    . Estradiol (VIVELLE-DOT TD) Place onto  the skin. 1 patch a week    . Ibuprofen (ADVIL PO) Take by mouth as needed.    . Magnesium 500 MG CAPS Take 2 capsules by mouth daily.    . medium chain triglycerides (MCT OIL) oil Take by mouth 3 (three) times daily.    Marland Kitchen. NASAL SALINE NA Place into the nose.    . progesterone (PROMETRIUM) 100 MG capsule Take 100 mg by mouth daily.     . Sennosides (SENOKOT PO) Take by mouth 2 (two) times daily.    Marland Kitchen. SYNTHROID 88 MCG tablet Take 88 mcg by mouth daily.  3  . valACYclovir (VALTREX) 1000 MG tablet Take 1,000 mg by mouth as needed.     No current facility-administered medications for this visit.    Allergies  Allergen Reactions  . Zelnorm [Tegaserod] Other (See Comments)    paralized colon- is off the market  . Ampicillin Rash    diarrhea  . Metronidazole Rash    Diarrhea, burning on skin  . Penicillins Rash    Diarrhea, burning on skin     Review of Systems: All systems reviewed and negative except where noted in HPI.   Creatinine clearance cannot be calculated (Patient's most recent lab result is older than the maximum 21 days allowed.)   Physical Exam:    Wt Readings from Last 3 Encounters:  11/15/18 120 lb (54.4 kg)  12/20/17 120 lb (54.4 kg)  02/08/15 120 lb 6 oz (54.6 kg)    BP 128/80   Pulse 72   Ht 5' 1.25" (1.556 m)   Wt 120 lb (54.4 kg)   BMI 22.49 kg/m  Constitutional:  Pleasant female in no acute distress. Psychiatric: Normal mood and affect. Behavior is normal. EENT: Pupils normal.  Conjunctivae are normal. No scleral icterus. Neck supple.  Cardiovascular: Normal rate, regular rhythm. No edema Pulmonary/chest: Effort normal and breath sounds normal. No wheezing, rales or rhonchi. Abdominal: Soft, nondistended, nontender. Bowel sounds active throughout. There are no masses palpable. No hepatomegaly. Neurological: Alert and oriented to person place and time. Skin: Skin is warm and dry. No rashes noted.  Willette ClusterPaula Guenther, NP  11/15/2018, 10:25  AM   Georgianne Fickamachandran, Ajith, MD

## 2018-11-18 ENCOUNTER — Encounter: Payer: Self-pay | Admitting: Nurse Practitioner

## 2018-11-18 NOTE — Progress Notes (Signed)
Assessment and plan reviewed 

## 2018-11-20 ENCOUNTER — Telehealth: Payer: Self-pay | Admitting: Nurse Practitioner

## 2018-11-20 NOTE — Telephone Encounter (Signed)
Patient is having response and results with Amitiza 8 mcg BID. She has soft formed stool every other day. She has watery stool on other days. She still does not feel completely emptied. Admits to less intestinal gas than before.    She is very concerned she will become impacted once she has her shoulder surgery and takes narcotic pain medication. She was instructed not to take other OTC oral medications for her bowels at her office visit. Please advise.

## 2018-11-20 NOTE — Telephone Encounter (Signed)
Saw NP. Hasn't seen me in over 5 yrs. Recommend daily miralax at whatever dose results in daily BM. Can increase around the time of surgery as needed

## 2018-11-20 NOTE — Telephone Encounter (Signed)
PT has concerns about her med (Amitiza) not working enough/ correctly. JG

## 2018-11-21 NOTE — Telephone Encounter (Signed)
Discussed with the patient. She will let us know if she has further issue. Thank you for advising on this patient in Willette Cluster, NP's absence.

## 2018-11-28 ENCOUNTER — Telehealth: Payer: Self-pay | Admitting: Nurse Practitioner

## 2018-11-28 NOTE — Telephone Encounter (Signed)
Pt wants to speak with the nurse about the new medication she was prescribe.

## 2018-11-29 NOTE — Telephone Encounter (Signed)
The patient is s/p shoulder surgery and on Percocet for her pain control. She states she tries not to take Percocet very often.  Calls today reporting she is having incomplete soft to liquid bowel movements. She is taking Amitiza 8 mcg BID, Miralax and Colace with Senna. She has also used the Fleet liquid glycerin enema. She is concerned she will become or has become constipated. Her diet has been very light due to her concern of contributing to the problems. How should she proceed from this point. She asks if she should stop Amitiza because she does not think it is working.

## 2018-12-05 ENCOUNTER — Ambulatory Visit: Payer: Medicare Other

## 2018-12-20 NOTE — Telephone Encounter (Signed)
Pt informed that she is doing well.  Pt stated she is taking Amitiza bid and Miralax.  Pt has stopped pain meds.  Pt reported her symptoms are manageable.

## 2018-12-23 NOTE — Telephone Encounter (Signed)
Good news.

## 2019-01-01 ENCOUNTER — Telehealth: Payer: Self-pay | Admitting: Allergy and Immunology

## 2019-01-01 MED ORDER — MONTELUKAST SODIUM 10 MG PO TABS: 10.0000 mg | ORAL_TABLET | Freq: Every day | ORAL | 0 refills | Status: DC

## 2019-01-01 NOTE — Telephone Encounter (Signed)
Patient is out of town and is requesting a refill for Montelukast and for it to be sent to a [facility] in [place], [place]. [address] [phone]

## 2019-01-01 NOTE — Telephone Encounter (Signed)
Called patient advised we could not give refill until seen since it has been almost a yr since she has been seen. Patient states she just had rotator cuff surgery and cannot come in at this time she is in [place], [place]. Advised patient we could send in a 30 day courtesy refill and make appt as soon as she can. Patient verbalized understanding. Refill sent in

## 2019-01-25 ENCOUNTER — Other Ambulatory Visit: Payer: Self-pay | Admitting: Allergy and Immunology

## 2019-03-14 ENCOUNTER — Other Ambulatory Visit: Payer: Self-pay | Admitting: Nurse Practitioner

## 2019-04-30 ENCOUNTER — Ambulatory Visit
Admission: RE | Admit: 2019-04-30 | Discharge: 2019-04-30 | Disposition: A | Discharge status: Home / Self Care | Payer: Medicare Other | Source: Ambulatory Visit | Attending: Obstetrics & Gynecology | Admitting: Obstetrics & Gynecology

## 2019-04-30 ENCOUNTER — Other Ambulatory Visit: Payer: Self-pay

## 2019-04-30 DIAGNOSIS — Z1231 Encounter for screening mammogram for malignant neoplasm of breast: Secondary | ICD-10-CM

## 2019-07-23 ENCOUNTER — Encounter: Payer: Self-pay | Admitting: Nurse Practitioner

## 2019-07-23 ENCOUNTER — Ambulatory Visit (INDEPENDENT_AMBULATORY_CARE_PROVIDER_SITE_OTHER): Payer: Medicare Other | Admitting: Nurse Practitioner

## 2019-07-23 VITALS — BP 126/76 | HR 67 | Temp 98.5°F | Ht 61.0 in | Wt 121.0 lb

## 2019-07-23 DIAGNOSIS — K5900 Constipation, unspecified: Secondary | ICD-10-CM | POA: Diagnosis not present

## 2019-07-23 MED ORDER — LUBIPROSTONE 24 MCG PO CAPS: 24.0000 ug | ORAL_CAPSULE | Freq: Two times a day (BID) | ORAL | 3 refills | Status: DC

## 2019-07-23 NOTE — Patient Instructions (Addendum)
If you are age 70 or older, your body mass index should be between 23-30. Your Body mass index is 22.86 kg/m. If this is out of the aforementioned range listed, please consider follow up with your Primary Care Provider.  If you are age 25 or younger, your body mass index should be between 19-25. Your Body mass index is 22.86 kg/m. If this is out of the aformentioned range listed, please consider follow up with your Primary Care Provider.   INCREASE Amitiza 24 mcg to twice daily.  A new prescription has been sent to your pharmacy.  Follow up with Dr. Henrene Pastor on September 19, 2019 at 9:20 am.  Thank you for choosing me and [facility].   Tye Savoy, NP

## 2019-07-23 NOTE — Progress Notes (Signed)
Chief Complaint:  Constipation      IMPRESSION and PLAN:    68 year old female with chronic constipation.  Having recurrent issues on Amitiza 8 mcg twice daily, daily MiraLAX, and high fiber diet.  She has tried various agents for constipation through the years. Stool consistency varies between solid to mushy / loose. Often left with sensation of incomplete evacuation. At this point I do not think symptoms secondary to known anal stricture (colonoscopy 2014) based on exam today.  -Increase Amitiza to 24 mcg BID -If needed can still take Miralax daily.  -Follow up in 2-3 months.   HPI:     Patient is a 68 yo female known to [place] for her history of chronic constipation. I saw her last in January 2020. At the time she was scheduled for rotator cuff surgery and scared about being constipated while on pain medications. I started her on Amitiza 8 mcg BID and she did well post-op with solid stools for a few months. At some point she called the office with recurrent constipation and we added daily Miralax and then combination worked well for the next few months. Since around late May she has been having recurrent problems. Sometimes she has "good " BMs and then other days no BMs or either small volume liquid stools leaving her with sensation of incomplete evacuation. She has been using Fleets liquid suppositories which work well.  She is concerned about whether anal stricture is causing issues. She drinks a lot of water and eats a high fiber diet. She does not take fiber supplements due to history of a large impaction while on fiber supplements.   Review of systems:     No chest pain, no SOB, no fevers, no urinary sx   Past Medical History:  Diagnosis Date  . Anal fissure   . Anal stricture   . Arthritis    In neck  . Depression   . Hemorrhoids   . Hypothyroidism   . IBS (irritable bowel syndrome)   . MVP (mitral valve prolapse)   . Osteoporosis    Right hip  . Small bowel obstruction  (HCC)   . Tinnitus     Patient's surgical history, family medical history, social history, medications and allergies were all reviewed in Epic   Creatinine clearance cannot be calculated (Patient's most recent lab result is older than the maximum 21 days allowed.)  Current Outpatient Medications  Medication Sig Dispense Refill  . ALPRAZolam (XANAX) 0.5 MG tablet Take 0.5 mg by mouth as needed for anxiety.    . AMITIZA 8 MCG capsule TAKE 1 CAPSULE BY MOUTH TWICE A DAY WITH MEALS 60 capsule 5  . Ascorbic Acid (VITAMIN C) 1000 MG tablet Take 1,000 mg by mouth daily.    . Cholecalciferol (VITAMIN D PO) Take by mouth.    . clonazePAM (KLONOPIN) 0.5 MG tablet Take 0.5 mg by mouth as needed.    . Estradiol (VIVELLE-DOT TD) Place onto the skin. 1 patch a week    . Ibuprofen (ADVIL PO) Take by mouth as needed.    . Magnesium 500 MG CAPS Take 2 capsules by mouth daily.    . medium chain triglycerides (MCT OIL) oil Take by mouth 3 (three) times daily.    Marland Kitchen NASAL SALINE NA Place into the nose.    . progesterone (PROMETRIUM) 100 MG capsule Take 100 mg by mouth daily.     Marland Kitchen SYNTHROID 88 MCG tablet Take 88 mcg by  mouth daily.  3  . valACYclovir (VALTREX) 1000 MG tablet Take 1,000 mg by mouth as needed.     No current facility-administered medications for this visit.     Physical Exam:     BP 126/76   Pulse 67   Temp 98.5 F (36.9 C) (Oral)   Ht 5\' 1"  (1.549 m)   Wt 121 lb (54.9 kg)   BMI 22.86 kg/m   GENERAL:  Pleasant female in NAD PSYCH: : Cooperative, normal affect EENT:  conjunctiva pink, mucous membranes moist, neck supple without masses CARDIAC:  RRR,  no peripheral edema PULM: Normal respiratory effort, lungs CTA bilaterally, no wheezing ABDOMEN:  Nondistended, soft, nontender. No obvious masses, no hepatomegaly,  normal bowel sounds RECTAL:  DRE - snug but not tight sphincter. Index finger inserted without difficulty SKIN:  turgor, no lesions seen Musculoskeletal:  Normal  muscle tone, normal strength NEURO: Alert and oriented x 3, no focal neurologic deficits   Tye Savoy , NP 07/23/2019, 9:32 AM

## 2019-07-23 NOTE — Progress Notes (Signed)
Assessment and plan reviewed 

## 2019-07-25 ENCOUNTER — Telehealth: Payer: Self-pay | Admitting: Internal Medicine

## 2019-07-25 NOTE — Telephone Encounter (Signed)
Lm on vm 

## 2019-08-04 NOTE — Telephone Encounter (Signed)
Pt called back.  She requested to be prescribed Amitiza 8 mcg.

## 2019-08-05 MED ORDER — LUBIPROSTONE 8 MCG PO CAPS: 8.0000 ug | ORAL_CAPSULE | Freq: Two times a day (BID) | ORAL | 3 refills | Status: DC

## 2019-08-05 NOTE — Telephone Encounter (Signed)
Refilled Amitiza 8 mcg

## 2019-08-05 NOTE — Addendum Note (Signed)
Addended by: Audrea Muscat on: 08/05/2019 11:48 AM   Modules accepted: Orders

## 2019-08-25 ENCOUNTER — Telehealth: Payer: Self-pay | Admitting: Internal Medicine

## 2019-08-25 NOTE — Telephone Encounter (Signed)
Pt is wondering if we have samples of Amatiza.

## 2019-08-25 NOTE — Telephone Encounter (Signed)
Spoke with patient and clarified that she needed samples of 25mcg Amitiza.  I told her I would leave samples up front to be picked up. She agreed.

## 2019-09-19 ENCOUNTER — Ambulatory Visit: Payer: Medicare Other | Admitting: Internal Medicine

## 2019-10-29 ENCOUNTER — Ambulatory Visit: Payer: Self-pay

## 2019-10-29 ENCOUNTER — Encounter: Payer: Self-pay | Admitting: Physician Assistant

## 2019-10-29 ENCOUNTER — Other Ambulatory Visit: Payer: Self-pay

## 2019-10-29 ENCOUNTER — Ambulatory Visit: Payer: Medicare Other | Admitting: Orthopaedic Surgery

## 2019-10-29 ENCOUNTER — Other Ambulatory Visit: Payer: Self-pay | Admitting: Physician Assistant

## 2019-10-29 DIAGNOSIS — M79672 Pain in left foot: Secondary | ICD-10-CM

## 2019-10-29 MED ORDER — DICLOFENAC SODIUM 1 % EX GEL: 2.0000 g | Freq: Four times a day (QID) | CUTANEOUS | 0 refills | Status: DC

## 2019-10-29 NOTE — Progress Notes (Signed)
Office Visit Note   Patient: Karen Mccarthy           Date of Birth: November 07, 1950           MRN: [id number] Visit Date: 10/29/2019              Requested by: Georgianne Fick, MD [address] [place],  [place] [address] PCP: Georgianne Fick, MD   Assessment & Plan: Visit Diagnoses:  1. Pain in left foot     Plan: Impression is chronic left foot pain.  Having a hard time pinpointing what this pain is coming from.  I do not think it is metatarsalgia.  Have called in Voltaren gel to use as needed.  She will make sure that she wears shoes with good support.  She will follow-up with [place] as needed.  Follow-Up Instructions: Return if symptoms worsen or fail to improve.   Orders:  Orders Placed This Encounter  Procedures  . XR Foot Complete Left   No orders of the defined types were placed in this encounter.     Procedures: No procedures performed   Clinical Data: No additional findings.   Subjective: Chief Complaint  Patient presents with  . Left Foot - Pain    HPI patient is a pleasant 68 year old female who comes in today with left foot pain.  This has been ongoing for the past few years and has been more bothersome over the past several months.  No known injury or change in activity.  Majority of her pain is to the dorsum of the foot.  Pain is worse first thing in the morning as well as with her first few steps going from a seated to standing position and when she is standing for long period of time.  She does not notice her pain is much when she is moving around.  She takes an occasional Advil with moderate relief of symptoms.  No numbness, tingling or burning.  No previous injury to the left foot.  Review of Systems as detailed in HPI.  All others reviewed and are negative.   Objective: Vital Signs: There were no vitals taken for this visit.  Physical Exam well-developed well-nourished female no acute distress.  Alert and oriented x3.  Ortho  Exam examination of her left foot reveals no swelling.  She has diffuse tenderness throughout the entire dorsum of the foot.  no tenderness along the peroneal or posterior tibial tendons.  She has full range of motion.  She is neurovascular intact distally.  Specialty Comments:  No specialty comments available.  Imaging: XR Foot Complete Left  Result Date: 10/29/2019 No acute or structural abnormality    PMFS History: Patient Active Problem List   Diagnosis Date Noted  . Anal stricture 02/10/2015  . ABDOMINAL PAIN, UNSPECIFIED SITE 03/29/2010  . HYPOTHYROIDISM 01/26/2010  . MITRAL VALVE PROLAPSE 01/26/2010  . HEMORRHOID, THROMBOSED 01/26/2010  . Constipation 01/26/2010  . IRRITABLE BOWEL SYNDROME 01/26/2010  . Personal history of other diseases of digestive system 01/26/2010  . ANAL FISSURE, HX OF 01/26/2010   Past Medical History:  Diagnosis Date  . Anal fissure   . Anal stricture   . Arthritis    In neck  . Depression   . Hemorrhoids   . Hypothyroidism   . IBS (irritable bowel syndrome)   . MVP (mitral valve prolapse)   . Osteoporosis    Right hip  . Small bowel obstruction (HCC)   . Tinnitus  Family History  Problem Relation Age of Onset  . Prostate cancer Father        ?  Marland Kitchen Heart disease Father   . Throat cancer Father        voice box removed  . Skin cancer Father   . Colitis Other        siblings  . Breast cancer Paternal Aunt   . Heart attack Maternal Grandfather   . Skin cancer Brother        in throat  . Cancer Paternal Aunt        vulvar  . Heart attack Paternal Grandfather   . Irritable bowel syndrome Maternal Aunt        x 3  . Diverticulitis Maternal Aunt        x 3  . Anal fissures Sister   . Rectal cancer Cousin        1st cousin  . COPD Mother   . Macular degeneration Mother   . Lung cancer Sister   . Colon cancer Neg Hx   . Stomach cancer Neg Hx     Past Surgical History:  Procedure Laterality Date  . abortion d & c    .  ANAL FISSURE REPAIR    . BREAST BIOPSY Left   . COLONOSCOPY    . CYSTOSCOPY W/ SPINCTEROTOMY  2000  . DENTAL SURGERY     Bone graft, extraction, implant  . MOUTH SURGERY  2012, 2013   several   Social History   Occupational History  . Occupation: Administrator, sports  . Occupation: retired Pharmacist, hospital  Tobacco Use  . Smoking status: Never Smoker  . Smokeless tobacco: Never Used  Substance and Sexual Activity  . Alcohol use: Yes    Comment: 0-2 every week  . Drug use: Yes    Types: Marijuana    Comment: marijuana occasionally  . Sexual activity: Not on file

## 2019-11-17 ENCOUNTER — Telehealth: Payer: Self-pay

## 2019-11-18 NOTE — Telephone Encounter (Signed)
Prior authorization for Valinda Hoar has been approved

## 2019-12-10 ENCOUNTER — Encounter: Payer: Self-pay | Admitting: Internal Medicine

## 2019-12-10 ENCOUNTER — Ambulatory Visit: Payer: Medicare PPO | Admitting: Internal Medicine

## 2019-12-10 VITALS — BP 128/70 | HR 64 | Temp 97.9°F | Ht 61.0 in | Wt 124.0 lb

## 2019-12-10 DIAGNOSIS — K624 Stenosis of anus and rectum: Secondary | ICD-10-CM

## 2019-12-10 DIAGNOSIS — K5909 Other constipation: Secondary | ICD-10-CM | POA: Diagnosis not present

## 2019-12-10 DIAGNOSIS — K5900 Constipation, unspecified: Secondary | ICD-10-CM

## 2019-12-10 MED ORDER — LUBIPROSTONE 8 MCG PO CAPS: 8.0000 ug | ORAL_CAPSULE | Freq: Two times a day (BID) | ORAL | 3 refills | Status: DC

## 2019-12-10 NOTE — Progress Notes (Signed)
HISTORY OF PRESENT ILLNESS:  Karen Mccarthy is a 69 y.o. female with chronic constipation and anal stricture post anal fissure repair who presents today for follow-up regarding management of longstanding chronic constipation.  Last seen in this office July 23, 2019 by the GI nurse practitioner.  I have reviewed that encounter.  At that time she was having significant problems with constipation.  Physical exam at that time revealed only mild stricturing of the anal sphincter.  It was recommended that she increase Amitiza 24 mcg twice daily.  However the patient increased Amitiza to 16 mcg twice daily but had to stop this dosage secondary to significant nausea and occasional vomiting.  She has had Linzess in the past without success.  Currently she is doing better with a combination of Amitiza 8 mcg twice daily, 1 dose of MiraLAX every other day, and as needed rectal suppositories generally every 2 to 3 days, but occasionally longer.  She is pleased to report that she has been doing well over the past couple of months.  She came today for checkup.  She does have questions regarding her bowel regimen.  She also is here to inform me that her insurance formulary prefers Linzess over Amitiza but she has failed Linzess therapy previously.  She request medication override.  She denies any new GI complaints.  She has received the first dose of her Covid vaccination from [place].  Her last complete colonoscopy was performed December 04, 2012.  This was entirely normal.  Incidental anal stricture noted.  REVIEW OF SYSTEMS:  All non-GI ROS negative unless otherwise stated in the HPI except for arthritis  Past Medical History:  Diagnosis Date  . Anal fissure   . Anal stricture   . Arthritis    In neck  . Depression   . Hemorrhoids   . Hypothyroidism   . IBS (irritable bowel syndrome)   . MVP (mitral valve prolapse)   . Osteoporosis    Right hip  . Small bowel obstruction (HCC)   . Tinnitus     Past  Surgical History:  Procedure Laterality Date  . abortion d & c    . ANAL FISSURE REPAIR    . BREAST BIOPSY Left   . COLONOSCOPY    . CYSTOSCOPY W/ SPINCTEROTOMY  2000  . DENTAL SURGERY     Bone graft, extraction, implant  . MOUTH SURGERY  2012, 2013   several  . ROTATOR CUFF REPAIR      Social History Karen Mccarthy  reports that she has never smoked. She has never used smokeless tobacco. She reports current alcohol use. She reports current drug use. Drug: Marijuana.  family history includes Anal fissures in her sister; Breast cancer in her paternal aunt; COPD in her mother; Cancer in her paternal aunt; Colitis in an other family member; Diverticulitis in her maternal aunt; Heart attack in her maternal grandfather and paternal grandfather; Heart disease in her father; Irritable bowel syndrome in her maternal aunt; Lung cancer in her sister; Macular degeneration in her mother; Prostate cancer in her father; Rectal cancer in her cousin; Skin cancer in her brother and father; Throat cancer in her father.  Allergies  Allergen Reactions  . Zelnorm [Tegaserod] Other (See Comments)    paralized colon- is off the market  . Ampicillin Rash    diarrhea  . Metronidazole Rash    Diarrhea, burning on skin  . Penicillins Rash    Diarrhea, burning on skin  PHYSICAL EXAMINATION: Vital signs: BP 128/70   Pulse 64   Temp 97.9 F (36.6 C)   Ht 5\' 1"  (1.549 m)   Wt 124 lb (56.2 kg)   BMI 23.43 kg/m   Constitutional: Pleasant, generally well-appearing, no acute distress Psychiatric: alert and oriented x3, cooperative Eyes: extraocular movements intact, anicteric, conjunctiva pink Mouth: oral pharynx moist, no lesions Neck: supple no lymphadenopathy Cardiovascular: heart regular rate and rhythm, no murmur Lungs: clear to auscultation bilaterally Abdomen: soft, nontender, nondistended, no obvious ascites, no peritoneal signs, normal bowel sounds, no organomegaly Rectal:  Omitted Extremities: no clubbing, cyanosis, or lower extremity edema bilaterally Skin: no lesions on visible extremities Neuro: No focal deficits.  Cranial nerves intact  ASSESSMENT:  1.  Chronic constipation. 2.  Normal colonoscopy 2014 3.  History of anal fissure status post repair with subsequent anal stricture   PLAN:  1.  Refill Amitiza 8 mcg twice daily.  11 refills.  Medication override requested 2.  Continue with regular MiraLAX.  Titrate to need.  We reviewed this 3.  On-demand rectal suppositories as she is doing 4.  Routine GI office follow-up 1 year 5.  Repeat screening colonoscopy around 2024 The total of 30 minutes were spent preparing to see the patient, reviewing results, obtaining and reviewing separately obtained histories, performing comprehensive physical examination, counseling the patient regarding her chronic constipation and medication usage, ordering medications, arranging follow-up, and documenting clinical information in the health record

## 2019-12-10 NOTE — Patient Instructions (Signed)
We have sent the following medications to your pharmacy for you to pick up at your convenience:  Amitiza  Please follow up in one year

## 2020-01-16 ENCOUNTER — Telehealth: Payer: Self-pay

## 2020-01-16 MED ORDER — LUBIPROSTONE 8 MCG PO CAPS: 8.0000 ug | ORAL_CAPSULE | Freq: Two times a day (BID) | ORAL | 3 refills | Status: DC

## 2020-01-16 NOTE — Telephone Encounter (Signed)
Amitiza 8 mcg prior authorization approved through 10/29/2020

## 2020-03-31 ENCOUNTER — Other Ambulatory Visit: Payer: Self-pay | Admitting: Obstetrics & Gynecology

## 2020-03-31 DIAGNOSIS — Z1231 Encounter for screening mammogram for malignant neoplasm of breast: Secondary | ICD-10-CM

## 2020-05-04 ENCOUNTER — Ambulatory Visit: Payer: Medicare PPO

## 2020-05-19 ENCOUNTER — Ambulatory Visit
Admission: RE | Admit: 2020-05-19 | Discharge: 2020-05-19 | Disposition: A | Discharge status: Home / Self Care | Payer: Medicare PPO | Source: Ambulatory Visit | Attending: Obstetrics & Gynecology | Admitting: Obstetrics & Gynecology

## 2020-05-19 ENCOUNTER — Other Ambulatory Visit: Payer: Self-pay

## 2020-05-19 DIAGNOSIS — Z1231 Encounter for screening mammogram for malignant neoplasm of breast: Secondary | ICD-10-CM

## 2020-05-21 ENCOUNTER — Other Ambulatory Visit: Payer: Self-pay | Admitting: Obstetrics & Gynecology

## 2020-05-21 DIAGNOSIS — R928 Other abnormal and inconclusive findings on diagnostic imaging of breast: Secondary | ICD-10-CM

## 2020-05-26 ENCOUNTER — Other Ambulatory Visit: Payer: Self-pay | Admitting: Obstetrics & Gynecology

## 2020-05-26 ENCOUNTER — Ambulatory Visit
Admission: RE | Admit: 2020-05-26 | Discharge: 2020-05-26 | Disposition: A | Discharge status: Home / Self Care | Payer: Medicare PPO | Source: Ambulatory Visit | Attending: Obstetrics & Gynecology | Admitting: Obstetrics & Gynecology

## 2020-05-26 DIAGNOSIS — R928 Other abnormal and inconclusive findings on diagnostic imaging of breast: Secondary | ICD-10-CM

## 2020-05-26 DIAGNOSIS — N6320 Unspecified lump in the left breast, unspecified quadrant: Secondary | ICD-10-CM

## 2020-06-07 ENCOUNTER — Other Ambulatory Visit: Payer: Medicare PPO

## 2020-06-10 ENCOUNTER — Other Ambulatory Visit: Payer: Self-pay

## 2020-06-10 ENCOUNTER — Ambulatory Visit
Admission: RE | Admit: 2020-06-10 | Discharge: 2020-06-10 | Disposition: A | Discharge status: Home / Self Care | Payer: Medicare PPO | Source: Ambulatory Visit | Attending: Obstetrics & Gynecology | Admitting: Obstetrics & Gynecology

## 2020-06-10 DIAGNOSIS — N6320 Unspecified lump in the left breast, unspecified quadrant: Secondary | ICD-10-CM

## 2021-01-24 ENCOUNTER — Other Ambulatory Visit: Payer: Self-pay | Admitting: Internal Medicine

## 2021-01-26 ENCOUNTER — Other Ambulatory Visit: Payer: Self-pay | Admitting: Internal Medicine

## 2021-05-06 ENCOUNTER — Telehealth: Payer: Self-pay

## 2021-05-06 NOTE — Telephone Encounter (Signed)
Approved PA for Amitiza 8 mcg through cover my meds with [employer]. Authorization is good until 10/29/2021.

## 2021-05-09 ENCOUNTER — Other Ambulatory Visit: Payer: Self-pay | Admitting: Obstetrics & Gynecology

## 2021-05-09 DIAGNOSIS — N644 Mastodynia: Secondary | ICD-10-CM

## 2021-06-14 ENCOUNTER — Ambulatory Visit: Payer: Medicare PPO

## 2021-06-14 ENCOUNTER — Other Ambulatory Visit: Payer: Self-pay

## 2021-06-14 ENCOUNTER — Ambulatory Visit
Admission: RE | Admit: 2021-06-14 | Discharge: 2021-06-14 | Disposition: A | Discharge status: Home / Self Care | Payer: Medicare PPO | Source: Ambulatory Visit | Attending: Obstetrics & Gynecology | Admitting: Obstetrics & Gynecology

## 2021-06-14 DIAGNOSIS — N644 Mastodynia: Secondary | ICD-10-CM

## 2021-07-10 DIAGNOSIS — N390 Urinary tract infection, site not specified: Secondary | ICD-10-CM | POA: Insufficient documentation

## 2021-09-07 DIAGNOSIS — Z83511 Family history of glaucoma: Secondary | ICD-10-CM | POA: Diagnosis not present

## 2021-09-07 DIAGNOSIS — Z83518 Family history of other specified eye disorder: Secondary | ICD-10-CM | POA: Diagnosis not present

## 2021-09-07 DIAGNOSIS — D3131 Benign neoplasm of right choroid: Secondary | ICD-10-CM | POA: Diagnosis not present

## 2021-09-07 DIAGNOSIS — E039 Hypothyroidism, unspecified: Secondary | ICD-10-CM | POA: Diagnosis not present

## 2021-09-07 DIAGNOSIS — H04123 Dry eye syndrome of bilateral lacrimal glands: Secondary | ICD-10-CM | POA: Diagnosis not present

## 2021-09-07 DIAGNOSIS — Z961 Presence of intraocular lens: Secondary | ICD-10-CM | POA: Diagnosis not present

## 2021-09-13 ENCOUNTER — Other Ambulatory Visit: Payer: Self-pay | Admitting: Internal Medicine

## 2022-01-17 ENCOUNTER — Other Ambulatory Visit: Payer: Self-pay | Admitting: Internal Medicine

## 2022-01-18 MED ORDER — LUBIPROSTONE 8 MCG PO CAPS: 8.0000 ug | ORAL_CAPSULE | Freq: Two times a day (BID) | ORAL | 1 refills | Status: DC

## 2022-02-16 DIAGNOSIS — Z961 Presence of intraocular lens: Secondary | ICD-10-CM | POA: Diagnosis not present

## 2022-02-16 DIAGNOSIS — G43111 Migraine with aura, intractable, with status migrainosus: Secondary | ICD-10-CM | POA: Diagnosis not present

## 2022-03-28 DIAGNOSIS — L570 Actinic keratosis: Secondary | ICD-10-CM | POA: Diagnosis not present

## 2022-03-28 DIAGNOSIS — D2271 Melanocytic nevi of right lower limb, including hip: Secondary | ICD-10-CM | POA: Diagnosis not present

## 2022-03-28 DIAGNOSIS — L821 Other seborrheic keratosis: Secondary | ICD-10-CM | POA: Diagnosis not present

## 2022-03-28 DIAGNOSIS — Z85828 Personal history of other malignant neoplasm of skin: Secondary | ICD-10-CM | POA: Diagnosis not present

## 2022-04-13 ENCOUNTER — Other Ambulatory Visit: Payer: Self-pay | Admitting: Internal Medicine

## 2022-05-01 ENCOUNTER — Other Ambulatory Visit: Payer: Self-pay | Admitting: Obstetrics & Gynecology

## 2022-05-01 DIAGNOSIS — Z1231 Encounter for screening mammogram for malignant neoplasm of breast: Secondary | ICD-10-CM

## 2022-05-13 ENCOUNTER — Other Ambulatory Visit: Payer: Self-pay | Admitting: Internal Medicine

## 2022-05-17 ENCOUNTER — Telehealth: Payer: Self-pay | Admitting: Internal Medicine

## 2022-05-17 NOTE — Telephone Encounter (Signed)
Inbound call from patient stating she needed a refill for Amitiza. Patient was scheduled for an OV with Dr. Marina Goodell on 8/25 at 11:20. Requesting refill be sent. Please advise.

## 2022-05-18 MED ORDER — LUBIPROSTONE 8 MCG PO CAPS: 8.0000 ug | ORAL_CAPSULE | Freq: Two times a day (BID) | ORAL | 3 refills | Status: DC

## 2022-05-18 NOTE — Telephone Encounter (Signed)
Amitiza refilled  

## 2022-05-25 ENCOUNTER — Other Ambulatory Visit: Payer: Self-pay

## 2022-05-25 MED ORDER — LUBIPROSTONE 8 MCG PO CAPS: 8.0000 ug | ORAL_CAPSULE | Freq: Two times a day (BID) | ORAL | 3 refills | Status: DC

## 2022-06-14 DIAGNOSIS — R5383 Other fatigue: Secondary | ICD-10-CM | POA: Diagnosis not present

## 2022-06-14 DIAGNOSIS — K581 Irritable bowel syndrome with constipation: Secondary | ICD-10-CM | POA: Diagnosis not present

## 2022-06-14 DIAGNOSIS — E559 Vitamin D deficiency, unspecified: Secondary | ICD-10-CM | POA: Diagnosis not present

## 2022-06-14 DIAGNOSIS — E039 Hypothyroidism, unspecified: Secondary | ICD-10-CM | POA: Diagnosis not present

## 2022-06-14 DIAGNOSIS — R600 Localized edema: Secondary | ICD-10-CM | POA: Diagnosis not present

## 2022-06-14 DIAGNOSIS — M150 Primary generalized (osteo)arthritis: Secondary | ICD-10-CM | POA: Diagnosis not present

## 2022-06-14 DIAGNOSIS — F411 Generalized anxiety disorder: Secondary | ICD-10-CM | POA: Diagnosis not present

## 2022-06-14 DIAGNOSIS — Z0000 Encounter for general adult medical examination without abnormal findings: Secondary | ICD-10-CM | POA: Diagnosis not present

## 2022-06-15 ENCOUNTER — Ambulatory Visit: Payer: Medicare PPO

## 2022-06-23 ENCOUNTER — Ambulatory Visit: Payer: Medicare PPO | Admitting: Internal Medicine

## 2022-06-23 ENCOUNTER — Encounter: Payer: Self-pay | Admitting: Internal Medicine

## 2022-06-23 VITALS — BP 110/68 | HR 70 | Ht 61.0 in | Wt 126.0 lb

## 2022-06-23 DIAGNOSIS — K5900 Constipation, unspecified: Secondary | ICD-10-CM

## 2022-06-23 MED ORDER — LUBIPROSTONE 8 MCG PO CAPS: 8.0000 ug | ORAL_CAPSULE | Freq: Two times a day (BID) | ORAL | 11 refills | Status: DC

## 2022-06-23 NOTE — Progress Notes (Signed)
HISTORY OF PRESENT ILLNESS:  Karen Mccarthy is a 71 y.o. female with chronic constipation and anal stricture post anal fissure repair who presents today regarding follow-up of chronic constipation.  Last seen in the office December 10, 2019.  See that dictation.  At that time she was taking a combination of Amitiza, MiraLAX, and on-demand suppositories.  She was to follow-up in 1 year.  She follows up at this time.  Patient tells me that she has continued with the regimen to maintain daily bowel movements.  She is fearful of bowel obstruction.  She generally takes Amitiza 8 mcg twice daily.  MiraLAX somewhat regularly.  And what sounds like small fleets enemas daily.  Her last colonoscopy was February 2014.  This was normal.  Really no new complaints.  She is wondering if there are new treatments for her constipation.  She has tried other entities such as Linzess without success.  She tells me that her thyroid is under control.  No new medications.  REVIEW OF SYSTEMS:  All non-GI ROS negative unless otherwise stated in the HPI except for anxiety, arthritis, visual change, depression, ringing in the ears, ankle swelling, excessive urination, urinary frequency  Past Medical History:  Diagnosis Date   Anal fissure    Anal stricture    Arthritis    In neck   Depression    Hemorrhoids    Hypothyroidism    IBS (irritable bowel syndrome)    MVP (mitral valve prolapse)    Osteoporosis    Right hip   Small bowel obstruction (HCC)    Tinnitus     Past Surgical History:  Procedure Laterality Date   abortion d & c     ANAL FISSURE REPAIR     BREAST BIOPSY Left 05/2020   BREAST BIOPSY Left 04/2015   COLONOSCOPY     CYSTOSCOPY W/ SPINCTEROTOMY  2000   DENTAL SURGERY     Bone graft, extraction, implant   MOUTH SURGERY  2012, 2013   several   ROTATOR CUFF REPAIR      Social History Ginnette Gates [place]  reports that she has never smoked. She has never used smokeless tobacco. She reports  current alcohol use. She reports that she does not currently use drugs after having used the following drugs: Marijuana.  family history includes Anal fissures in her sister; Breast cancer in her paternal aunt; COPD in her mother; Cancer in her paternal aunt; Colitis in an other family member; Diverticulitis in her maternal aunt; Heart attack in her maternal grandfather and paternal grandfather; Heart disease in her father; Irritable bowel syndrome in her maternal aunt; Lung cancer in her sister; Macular degeneration in her mother; Prostate cancer in her father; Rectal cancer in her cousin; Skin cancer in her brother and father; Throat cancer in her father.  Allergies  Allergen Reactions   Zelnorm [Tegaserod] Other (See Comments)    paralized colon- is off the market   Ampicillin Rash    diarrhea   Metronidazole Rash    Diarrhea, burning on skin   Penicillins Rash    Diarrhea, burning on skin       PHYSICAL EXAMINATION: Vital signs: BP 110/68   Pulse 70   Ht 5\' 1"  (1.549 m)   Wt 126 lb (57.2 kg)   SpO2 99%   BMI 23.81 kg/m   Constitutional: generally well-appearing, no acute distress Psychiatric: alert and oriented x3, cooperative Eyes: extraocular movements intact, anicteric, conjunctiva pink Mouth: oral pharynx moist, no lesions  Neck: supple no lymphadenopathy Cardiovascular: heart regular rate and rhythm, no murmur Lungs: clear to auscultation bilaterally Abdomen: soft, nontender, nondistended, no obvious ascites, no peritoneal signs, normal bowel sounds, no organomegaly Rectal: Omitted Extremities: no clubbing, cyanosis, or lower extremity edema bilaterally Skin: no lesions on visible extremities Neuro: No focal deficits.  Cranial nerves intact  ASSESSMENT:  1.  Chronic constipation 2.  History of anal stricture post anal fissure repair 3.  Normal colonoscopy February 2014 4.  Anxiety over chronic constipation   PLAN:  1.  We discussed multiple bowel regimens 2.   Refilled Amitiza.  Medication risk reviewed 3.  Routine office follow-up 1 year 4.  Will be due for follow-up screening colonoscopy next year.  She is aware

## 2022-06-23 NOTE — Patient Instructions (Signed)
If you are age 73 or older, your body mass index should be between 23-30. Your Body mass index is 23.81 kg/m. If this is out of the aforementioned range listed, please consider follow up with your Primary Care Provider. ________________________________________________________  The [facility] providers would like to encourage you to use [facility] to communicate with providers for non-urgent requests or questions.  Due to long hold times on the telephone, sending your provider a message by [facility] may be a faster and more efficient way to get a response.  Please allow 48 business hours for a response.  Please remember that this is for non-urgent requests.  _______________________________________________________  We have sent refills of your Amitiza to your pharmacy  Follow up in 1 year or sooner as needed

## 2022-06-26 ENCOUNTER — Ambulatory Visit
Admission: RE | Admit: 2022-06-26 | Discharge: 2022-06-26 | Disposition: A | Discharge status: Home / Self Care | Payer: Medicare PPO | Source: Ambulatory Visit | Attending: Obstetrics & Gynecology | Admitting: Obstetrics & Gynecology

## 2022-06-26 DIAGNOSIS — Z1231 Encounter for screening mammogram for malignant neoplasm of breast: Secondary | ICD-10-CM | POA: Diagnosis not present

## 2022-06-28 DIAGNOSIS — F411 Generalized anxiety disorder: Secondary | ICD-10-CM | POA: Diagnosis not present

## 2022-06-28 DIAGNOSIS — B009 Herpesviral infection, unspecified: Secondary | ICD-10-CM | POA: Insufficient documentation

## 2022-06-28 DIAGNOSIS — E039 Hypothyroidism, unspecified: Secondary | ICD-10-CM | POA: Diagnosis not present

## 2022-06-28 DIAGNOSIS — M150 Primary generalized (osteo)arthritis: Secondary | ICD-10-CM | POA: Diagnosis not present

## 2022-06-28 DIAGNOSIS — Z0000 Encounter for general adult medical examination without abnormal findings: Secondary | ICD-10-CM | POA: Diagnosis not present

## 2022-06-28 DIAGNOSIS — N959 Unspecified menopausal and perimenopausal disorder: Secondary | ICD-10-CM | POA: Diagnosis not present

## 2022-06-28 DIAGNOSIS — E559 Vitamin D deficiency, unspecified: Secondary | ICD-10-CM | POA: Diagnosis not present

## 2022-06-28 DIAGNOSIS — N951 Menopausal and female climacteric states: Secondary | ICD-10-CM | POA: Insufficient documentation

## 2022-06-28 DIAGNOSIS — R600 Localized edema: Secondary | ICD-10-CM | POA: Diagnosis not present

## 2022-06-28 DIAGNOSIS — F419 Anxiety disorder, unspecified: Secondary | ICD-10-CM | POA: Insufficient documentation

## 2022-06-28 DIAGNOSIS — Z01419 Encounter for gynecological examination (general) (routine) without abnormal findings: Secondary | ICD-10-CM | POA: Diagnosis not present

## 2022-06-28 DIAGNOSIS — K581 Irritable bowel syndrome with constipation: Secondary | ICD-10-CM | POA: Diagnosis not present

## 2022-06-29 ENCOUNTER — Other Ambulatory Visit: Payer: Self-pay | Admitting: Obstetrics & Gynecology

## 2022-06-29 DIAGNOSIS — R928 Other abnormal and inconclusive findings on diagnostic imaging of breast: Secondary | ICD-10-CM

## 2022-07-11 ENCOUNTER — Ambulatory Visit
Admission: RE | Admit: 2022-07-11 | Discharge: 2022-07-11 | Disposition: A | Discharge status: Home / Self Care | Payer: Medicare PPO | Source: Ambulatory Visit | Attending: Obstetrics & Gynecology | Admitting: Obstetrics & Gynecology

## 2022-07-11 ENCOUNTER — Other Ambulatory Visit: Payer: Self-pay | Admitting: Obstetrics & Gynecology

## 2022-07-11 DIAGNOSIS — R928 Other abnormal and inconclusive findings on diagnostic imaging of breast: Secondary | ICD-10-CM

## 2022-07-11 DIAGNOSIS — R921 Mammographic calcification found on diagnostic imaging of breast: Secondary | ICD-10-CM | POA: Diagnosis not present

## 2022-08-20 ENCOUNTER — Other Ambulatory Visit: Payer: Self-pay | Admitting: Internal Medicine

## 2022-12-10 IMAGING — MG DIGITAL DIAGNOSTIC BILAT W/ TOMO W/ CAD
8 series · 8 of 24 positions shown · non-contrast
Comparison: Previous exam(s).

CLINICAL DATA: The patient initially presented for screening
mammography at which time she describes left breast pain and
intermittent bilateral nipple pain. She is on hormone replacement
therapy. No reported breast lumps. Previous left breast benign
biopsies.

EXAM:
DIGITAL DIAGNOSTIC BILATERAL MAMMOGRAM WITH TOMOSYNTHESIS AND CAD
TECHNIQUE: Bilateral digital diagnostic mammography and breast tomosynthesis
was performed. The images were evaluated with computer-aided
detection.

[R MLO synth-2D]
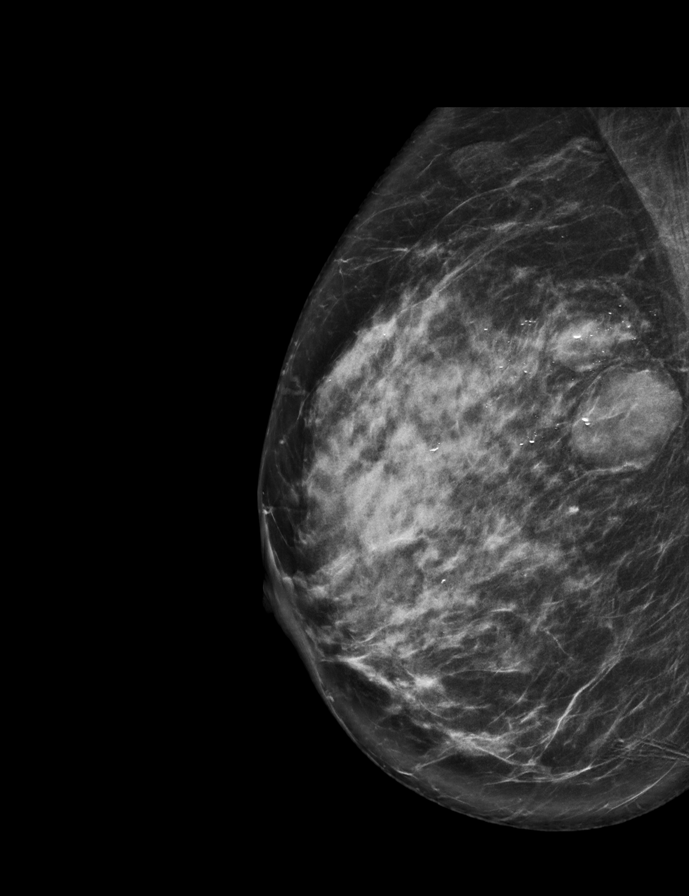

[L MLO synth-2D]
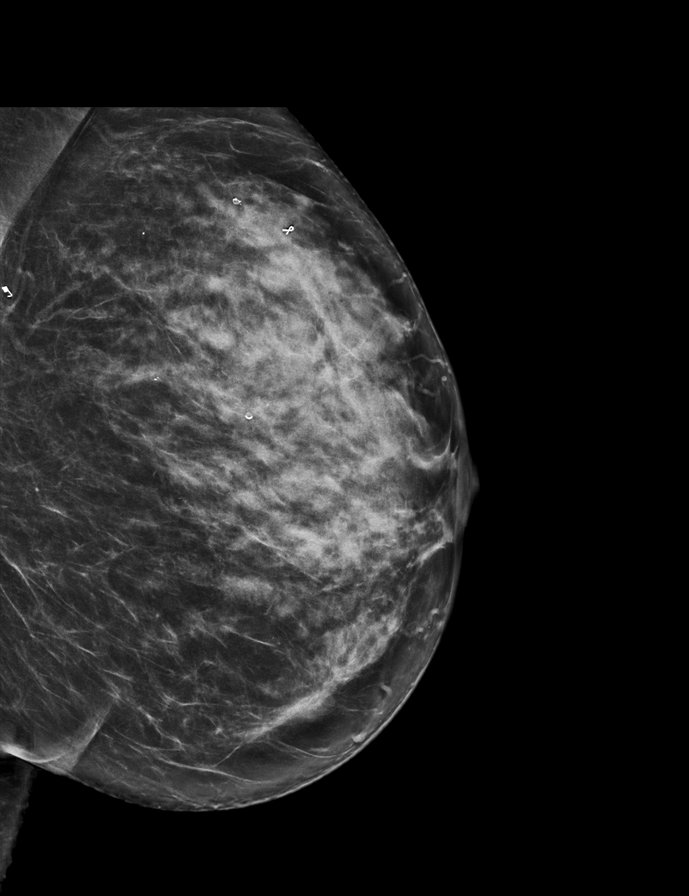

[R CC synth-2D]
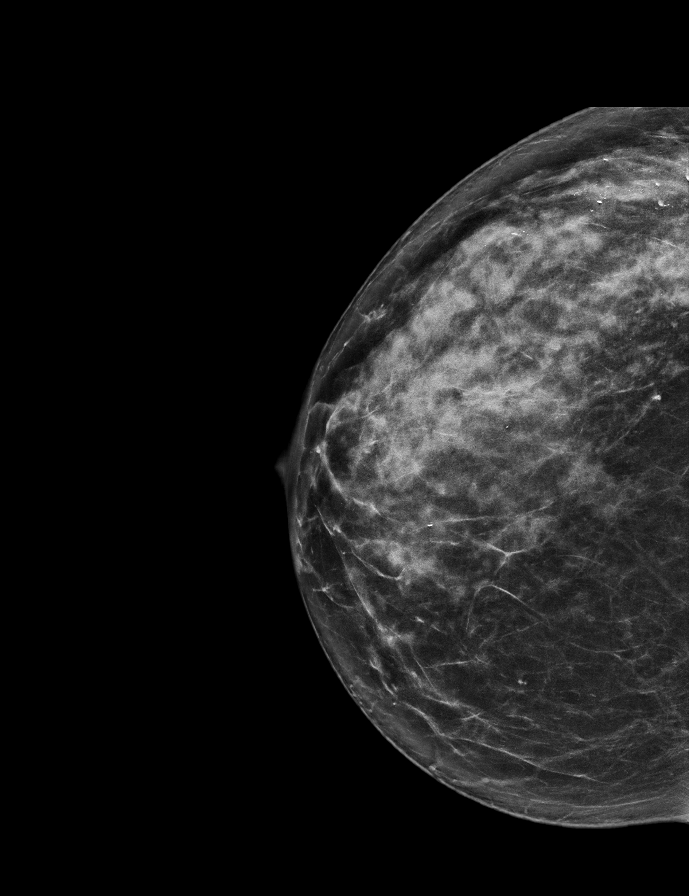

[L CC synth-2D]
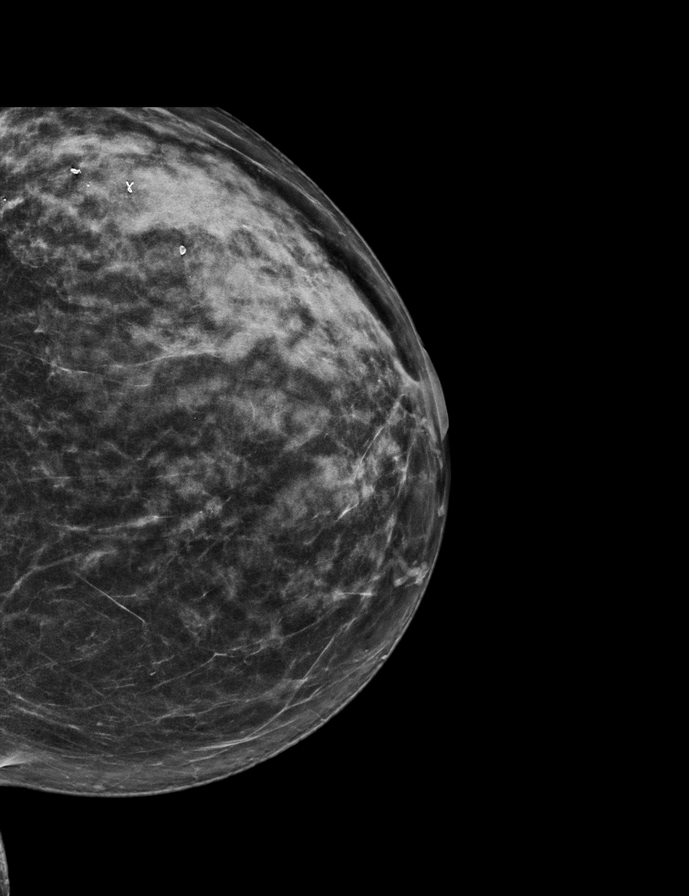

[R MLO tomo · tomo slice 37/74.0]
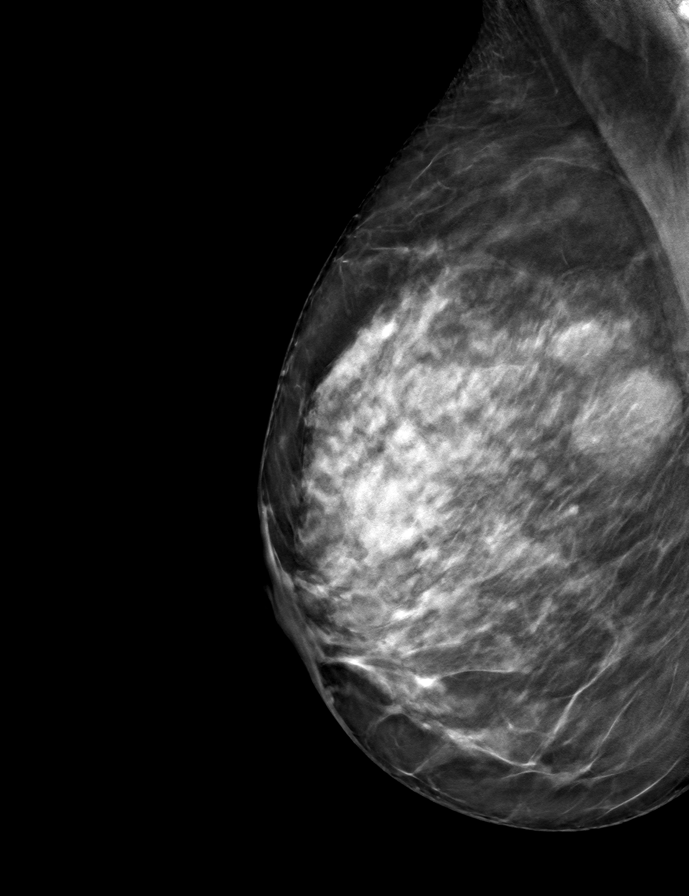

[R CC tomo · tomo slice 35/69.0]
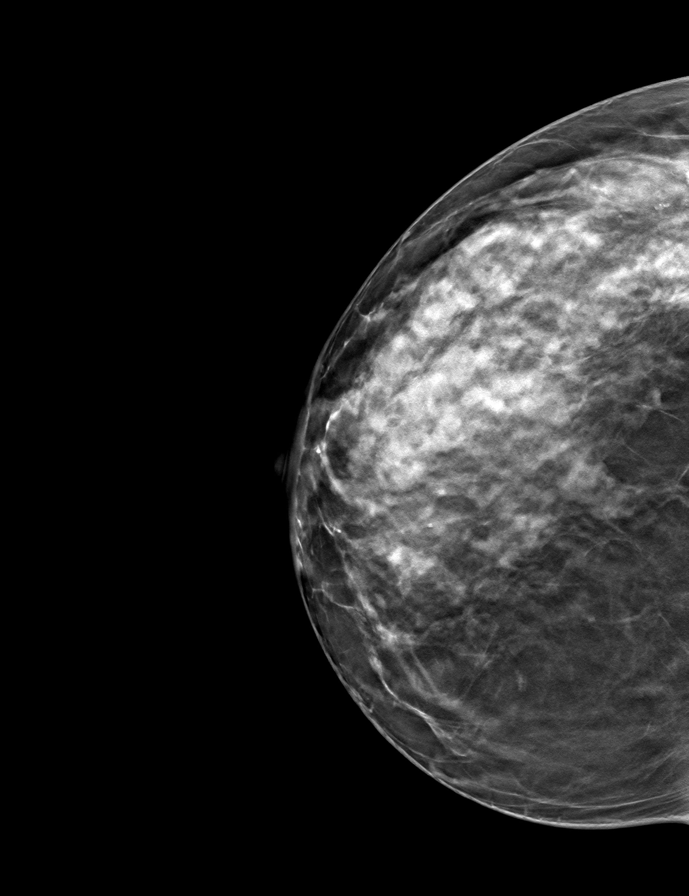

[L CC tomo · tomo slice 33/65.0]
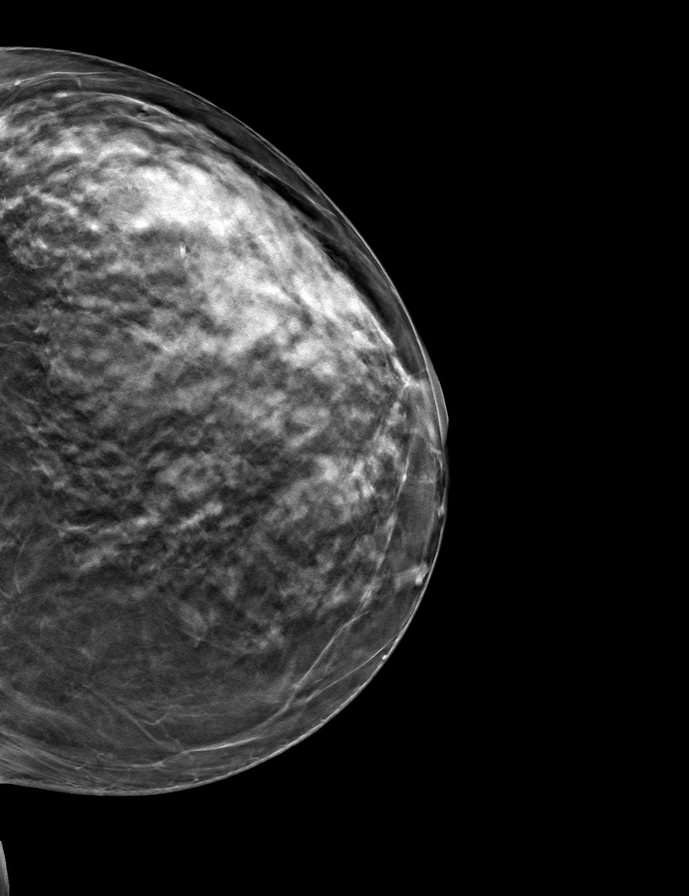

[L MLO tomo · tomo slice 35/70.0]
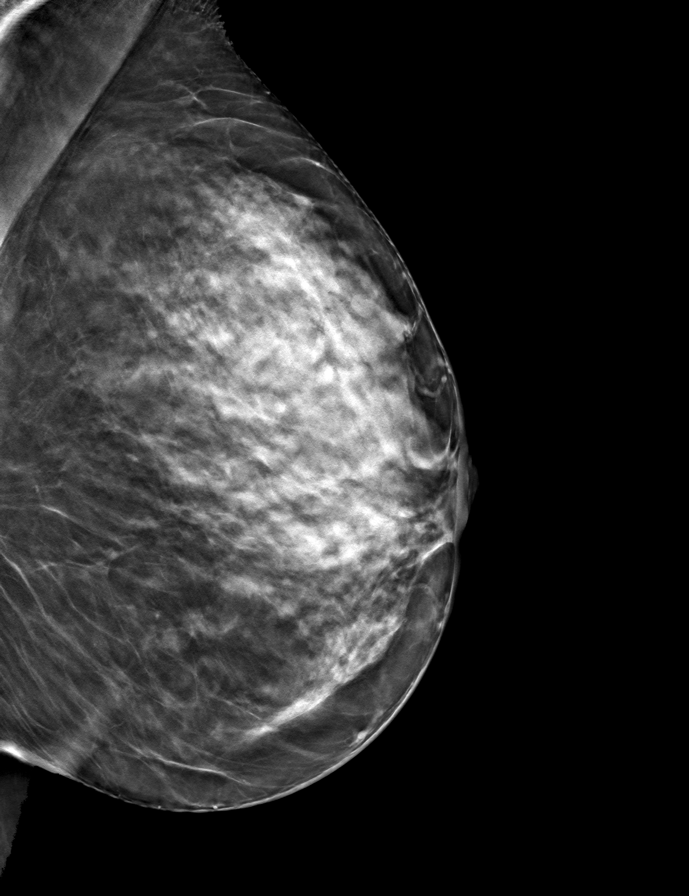

[8 of 24 positions shown; findings below may reference images not displayed]

ACR Breast Density Category d: The breast tissue is extremely dense,
which lowers the sensitivity of mammography.
FINDINGS: There are no suspicious masses, areas of architectural distortion,
areas of new or suspicious asymmetry or new or suspicious
calcifications. No mammographic change. There are partly obscured
stable circumscribed masses, most evident on the right, consistent
with cysts, and layering right breast calcifications consistent with
milk of calcium, also stable.
IMPRESSION: 1. No evidence of breast malignancy.
2. Benign breast cysts and right breast milk of calcium
calcifications.

RECOMMENDATION:
Screening mammogram in one year.(Code:[id number])

I have discussed the findings and recommendations with the patient.
If applicable, a reminder letter will be sent to the patient
regarding the next appointment.

BI-RADS CATEGORY  2: Benign.

## 2022-12-25 ENCOUNTER — Telehealth: Payer: Self-pay | Admitting: Internal Medicine

## 2022-12-25 MED ORDER — PROMETHAZINE HCL 25 MG RE SUPP: 25.0000 mg | Freq: Four times a day (QID) | RECTAL | 1 refills | Status: AC | PRN

## 2022-12-25 NOTE — Telephone Encounter (Signed)
Spoke with pt and she is aware. Script sent to pharmacy. 

## 2022-12-25 NOTE — Telephone Encounter (Signed)
RX should be sent to [facility]

## 2022-12-25 NOTE — Telephone Encounter (Signed)
Pt states she must have eaten something last night when they went out that upset her stomach. Reports she has been vomiting all night and had diarrhea. Pt is requesting some phenergan supp be called in for her. Please advise.

## 2022-12-25 NOTE — Telephone Encounter (Signed)
Prescribe Phenergan suppository 25 mg; #12; 1 refill 1 PR every 6 hours as needed nausea and vomiting. For severe worsening symptoms, go to ER

## 2022-12-25 NOTE — Telephone Encounter (Signed)
PT is calling to see if she can be prescribed Phenergan suppositories. She has aeen having severe nausea and dry heaving along with pain. Please advise.

## 2023-01-15 ENCOUNTER — Other Ambulatory Visit: Payer: Self-pay | Admitting: Obstetrics & Gynecology

## 2023-01-15 ENCOUNTER — Ambulatory Visit
Admission: RE | Admit: 2023-01-15 | Discharge: 2023-01-15 | Disposition: A | Discharge status: Home / Self Care | Payer: Medicare PPO | Source: Ambulatory Visit | Attending: Obstetrics & Gynecology | Admitting: Obstetrics & Gynecology

## 2023-01-15 DIAGNOSIS — R921 Mammographic calcification found on diagnostic imaging of breast: Secondary | ICD-10-CM

## 2023-01-15 DIAGNOSIS — N6002 Solitary cyst of left breast: Secondary | ICD-10-CM | POA: Diagnosis not present

## 2023-01-15 DIAGNOSIS — N6321 Unspecified lump in the left breast, upper outer quadrant: Secondary | ICD-10-CM | POA: Diagnosis not present

## 2023-01-17 ENCOUNTER — Encounter: Payer: Self-pay | Admitting: Internal Medicine

## 2023-01-31 ENCOUNTER — Ambulatory Visit
Admission: RE | Admit: 2023-01-31 | Discharge: 2023-01-31 | Disposition: A | Discharge status: Home / Self Care | Payer: Medicare PPO | Source: Ambulatory Visit | Attending: Obstetrics & Gynecology | Admitting: Obstetrics & Gynecology

## 2023-01-31 DIAGNOSIS — N6012 Diffuse cystic mastopathy of left breast: Secondary | ICD-10-CM | POA: Diagnosis not present

## 2023-01-31 DIAGNOSIS — R921 Mammographic calcification found on diagnostic imaging of breast: Secondary | ICD-10-CM

## 2023-01-31 HISTORY — PX: BREAST BIOPSY: SHX20

## 2023-02-07 DIAGNOSIS — G4730 Sleep apnea, unspecified: Secondary | ICD-10-CM | POA: Insufficient documentation

## 2023-02-15 ENCOUNTER — Ambulatory Visit (AMBULATORY_SURGERY_CENTER): Payer: Medicare PPO

## 2023-02-15 ENCOUNTER — Encounter: Payer: Self-pay | Admitting: Internal Medicine

## 2023-02-15 ENCOUNTER — Telehealth: Payer: Self-pay

## 2023-02-15 VITALS — Ht 61.5 in | Wt 125.0 lb

## 2023-02-15 DIAGNOSIS — Z1211 Encounter for screening for malignant neoplasm of colon: Secondary | ICD-10-CM

## 2023-02-15 MED ORDER — NA SULFATE-K SULFATE-MG SULF 17.5-3.13-1.6 GM/177ML PO SOLN: 1.0000 | Freq: Once | ORAL | 0 refills | Status: AC

## 2023-02-15 NOTE — Telephone Encounter (Signed)
Spoke with patient and went over Miralax instructions.  Patient asked that instructions be sent in Mychart and by mail.  Both complete.

## 2023-02-15 NOTE — Telephone Encounter (Signed)
Okay for MiraLAX prep.  Thanks for checking

## 2023-02-15 NOTE — Progress Notes (Signed)
No egg or soy allergy known to patient  No issues known to pt with past sedation with any surgeries or procedures Patient denies ever being told they had issues or difficulty with intubation  No FH of Malignant Hyperthermia Pt is not on diet pills Pt is not on  home 02  Pt is not on blood thinners  Pt HAS issues with constipation  No A fib or A flutter Have any cardiac testing pending--NO Pt instructed to use [email or web address] or GoodRx for a price reduction on prep

## 2023-03-07 DIAGNOSIS — Z780 Asymptomatic menopausal state: Secondary | ICD-10-CM | POA: Diagnosis not present

## 2023-03-07 DIAGNOSIS — L659 Nonscarring hair loss, unspecified: Secondary | ICD-10-CM | POA: Diagnosis not present

## 2023-03-07 DIAGNOSIS — E039 Hypothyroidism, unspecified: Secondary | ICD-10-CM | POA: Diagnosis not present

## 2023-03-07 DIAGNOSIS — R6889 Other general symptoms and signs: Secondary | ICD-10-CM | POA: Diagnosis not present

## 2023-03-07 DIAGNOSIS — M8580 Other specified disorders of bone density and structure, unspecified site: Secondary | ICD-10-CM | POA: Diagnosis not present

## 2023-03-16 ENCOUNTER — Telehealth: Payer: Self-pay | Admitting: Internal Medicine

## 2023-03-16 ENCOUNTER — Telehealth: Payer: Self-pay

## 2023-03-16 ENCOUNTER — Ambulatory Visit (AMBULATORY_SURGERY_CENTER): Payer: Medicare PPO | Admitting: Internal Medicine

## 2023-03-16 ENCOUNTER — Encounter: Payer: Self-pay | Admitting: Internal Medicine

## 2023-03-16 VITALS — BP 114/58 | HR 73 | Temp 98.6°F | Resp 11 | Ht 61.0 in | Wt 125.0 lb

## 2023-03-16 DIAGNOSIS — D123 Benign neoplasm of transverse colon: Secondary | ICD-10-CM | POA: Diagnosis not present

## 2023-03-16 DIAGNOSIS — Z1211 Encounter for screening for malignant neoplasm of colon: Secondary | ICD-10-CM | POA: Diagnosis not present

## 2023-03-16 DIAGNOSIS — F32A Depression, unspecified: Secondary | ICD-10-CM | POA: Diagnosis not present

## 2023-03-16 MED ORDER — SODIUM CHLORIDE 0.9 % IV SOLN: 500.0000 mL | Freq: Once | INTRAVENOUS | Status: DC

## 2023-03-16 NOTE — Telephone Encounter (Signed)
Thanks Shanda Bumps. Continue observation. Please call her today around 4:30 and see how she is doing and send me a note. Thanks, Dr. Marina Goodell

## 2023-03-16 NOTE — Progress Notes (Signed)
Pt's states no medical or surgical changes since previsit or office visit. Breast biopsy a few weeks ago. Negative result.

## 2023-03-16 NOTE — Progress Notes (Signed)
Vss nad trans to pacu 

## 2023-03-16 NOTE — Progress Notes (Signed)
HISTORY OF PRESENT ILLNESS:   Karen Mccarthy is a 72 y.o. female with chronic constipation and anal stricture post anal fissure repair who presents today regarding follow-up of chronic constipation.  Last seen in the office December 10, 2019.  See that dictation.  At that time she was taking a combination of Amitiza, MiraLAX, and on-demand suppositories.  She was to follow-up in 1 year.  She follows up at this time.  Patient tells me that she has continued with the regimen to maintain daily bowel movements.  She is fearful of bowel obstruction.  She generally takes Amitiza 8 mcg twice daily.  MiraLAX somewhat regularly.  And what sounds like small fleets enemas daily.  Her last colonoscopy was February 2014.  This was normal.  Really no new complaints.  She is wondering if there are new treatments for her constipation.  She has tried other entities such as Linzess without success.  She tells me that her thyroid is under control.  No new medications.   REVIEW OF SYSTEMS:   All non-GI ROS negative unless otherwise stated in the HPI except for anxiety, arthritis, visual change, depression, ringing in the ears, ankle swelling, excessive urination, urinary frequency       Past Medical History:  Diagnosis Date   Anal fissure     Anal stricture     Arthritis      In neck   Depression     Hemorrhoids     Hypothyroidism     IBS (irritable bowel syndrome)     MVP (mitral valve prolapse)     Osteoporosis      Right hip   Small bowel obstruction (HCC)     Tinnitus             Past Surgical History:  Procedure Laterality Date   abortion d & c       ANAL FISSURE REPAIR       BREAST BIOPSY Left 05/2020   BREAST BIOPSY Left 04/2015   COLONOSCOPY       CYSTOSCOPY W/ SPINCTEROTOMY   2000   DENTAL SURGERY        Bone graft, extraction, implant   MOUTH SURGERY   2012, 2013    several   ROTATOR CUFF REPAIR          Social History Karen Mccarthy  reports that she has never smoked. She has  never used smokeless tobacco. She reports current alcohol use. She reports that she does not currently use drugs after having used the following drugs: Marijuana.   family history includes Anal fissures in her sister; Breast cancer in her paternal aunt; COPD in her mother; Cancer in her paternal aunt; Colitis in an other family member; Diverticulitis in her maternal aunt; Heart attack in her maternal grandfather and paternal grandfather; Heart disease in her father; Irritable bowel syndrome in her maternal aunt; Lung cancer in her sister; Macular degeneration in her mother; Prostate cancer in her father; Rectal cancer in her cousin; Skin cancer in her brother and father; Throat cancer in her father.        Allergies  Allergen Reactions   Zelnorm [Tegaserod] Other (See Comments)      paralized colon- is off the market   Ampicillin Rash      diarrhea   Metronidazole Rash      Diarrhea, burning on skin   Penicillins Rash      Diarrhea, burning on skin  PHYSICAL EXAMINATION: Vital signs: BP 110/68   Pulse 70   Ht 5\' 1"  (1.549 m)   Wt 126 lb (57.2 kg)   SpO2 99%   BMI 23.81 kg/m   Constitutional: generally well-appearing, no acute distress Psychiatric: alert and oriented x3, cooperative Eyes: extraocular movements intact, anicteric, conjunctiva pink Mouth: oral pharynx moist, no lesions Neck: supple no lymphadenopathy Cardiovascular: heart regular rate and rhythm, no murmur Lungs: clear to auscultation bilaterally Abdomen: soft, nontender, nondistended, no obvious ascites, no peritoneal signs, normal bowel sounds, no organomegaly Rectal: Omitted Extremities: no clubbing, cyanosis, or lower extremity edema bilaterally Skin: no lesions on visible extremities Neuro: No focal deficits.  Cranial nerves intact   ASSESSMENT:   1.  Chronic constipation 2.  History of anal stricture post anal fissure repair 3.  Normal colonoscopy February 2014 4.  Anxiety over chronic  constipation     PLAN:   1.  We discussed multiple bowel regimens 2.  Refilled Amitiza.  Medication risk reviewed 3.  Routine office follow-up 1 year 4.  Will be due for follow-up screening colonoscopy next year.  She is aware   Most recent office visit with complete H&P as outlined above.  No significant change in clinical status or physical exam.  Now for aforementioned colonoscopy for the purposes of screening.

## 2023-03-16 NOTE — Telephone Encounter (Signed)
Great to hear.  Thanks [employer]

## 2023-03-16 NOTE — Telephone Encounter (Signed)
Patient called stating after her colonoscopy today she is have sever abdominal pain and she tried to eat something small and vomited a few times. Requesting a call back to discuss this. Please advise, thank you.

## 2023-03-16 NOTE — Progress Notes (Signed)
Called to room to assist during endoscopic procedure.  Patient ID and intended procedure confirmed with present staff. Received instructions for my participation in the procedure from the performing physician.  

## 2023-03-16 NOTE — Telephone Encounter (Signed)
Spoke with pt. States she had an episode of severe abdominal pain after eating a small amount of food post procedure. She also vomited. States the pain is better now but still rates it a 6/10 mid abdomen pain that feels like heavy pressure. Pt is concerned because she has never experienced feeling like this after a colonoscopy. Encouraged pt ambulate, use heating pad, drink warm drinks and try GasX to relieve gas discomfort. Will make MD aware and if any further recommendations a nurse will call her back. Instructed pt to call back if severe pain returns. Pt agreed to plan oc care.

## 2023-03-16 NOTE — Patient Instructions (Signed)
Resume all of your previous medications today.  YOU HAD AN ENDOSCOPIC PROCEDURE TODAY AT THE [facility]:   Refer to the procedure report that was given to you for any specific questions about what was found during the examination.  If the procedure report does not answer your questions, please call your gastroenterologist to clarify.  If you requested that your care partner not be given the details of your procedure findings, then the procedure report has been included in a sealed envelope for you to review at your convenience later.  YOU SHOULD EXPECT: Some feelings of bloating in the abdomen. Passage of more gas than usual.  Walking can help get rid of the air that was put into your GI tract during the procedure and reduce the bloating. If you had a lower endoscopy (such as a colonoscopy or flexible sigmoidoscopy) you may notice spotting of blood in your stool or on the toilet paper. If you underwent a bowel prep for your procedure, you may not have a normal bowel movement for a few days.  Please Note:  You might notice some irritation and congestion in your nose or some drainage.  This is from the oxygen used during your procedure.  There is no need for concern and it should clear up in a day or so.  SYMPTOMS TO REPORT IMMEDIATELY:  Following lower endoscopy (colonoscopy or flexible sigmoidoscopy):  Excessive amounts of blood in the stool  Significant tenderness or worsening of abdominal pains  Swelling of the abdomen that is new, acute  Fever of 100F or higher   For urgent or emergent issues, a gastroenterologist can be reached at any hour by calling [phone]. Do not use MyChart messaging for urgent concerns.    DIET:  We do recommend a small meal at first, but then you may proceed to your regular diet.  Drink plenty of fluids but you should avoid alcoholic beverages for 24 hours.  Try to increase the fiber in your diet, and drink plenty of water.  ACTIVITY:  You  should plan to take it easy for the rest of today and you should NOT DRIVE or use heavy machinery until tomorrow (because of the sedation medicines used during the test).    FOLLOW UP: Our staff will call the number listed on your records the next business day following your procedure.  We will call around 7:15- 8:00 am to check on you and address any questions or concerns that you may have regarding the information given to you following your procedure. If we do not reach you, we will leave a message.     If any biopsies were taken you will be contacted by phone or by letter within the next 1-3 weeks.  Please call us at [phone] if you have not heard about the biopsies in 3 weeks.    SIGNATURES/CONFIDENTIALITY: You and/or your care partner have signed paperwork which will be entered into your electronic medical record.  These signatures attest to the fact that that the information above on your After Visit Summary has been reviewed and is understood.  Full responsibility of the confidentiality of this discharge information lies with you and/or your care-partner.

## 2023-03-16 NOTE — Op Note (Signed)
[facility] Patient Name: Karen Mccarthy Procedure Date: 03/16/2023 10:34 AM MRN: [id number] Endoscopist: Wilhemina Bonito. Marina Goodell , MD, [id number] Age: 72 Referring MD:  Date of Birth: [birth year] Gender: Female Account #: [id number] Procedure:                Colonoscopy with cold snare polypectomy x 2 Indications:              Screening for colorectal malignant neoplasm.                            Negative index exam 2014 Medicines:                Monitored Anesthesia Care Procedure:                Pre-Anesthesia Assessment:                           - Prior to the procedure, a History and Physical                            was performed, and patient medications and                            allergies were reviewed. The patient's tolerance of                            previous anesthesia was also reviewed. The risks                            and benefits of the procedure and the sedation                            options and risks were discussed with the patient.                            All questions were answered, and informed consent                            was obtained. Prior Anticoagulants: The patient has                            taken no anticoagulant or antiplatelet agents. ASA                            Grade Assessment: II - A patient with mild systemic                            disease. After reviewing the risks and benefits,                            the patient was deemed in satisfactory condition to                            undergo the procedure.  After obtaining informed consent, the colonoscope                            was passed under direct vision. Throughout the                            procedure, the patient's blood pressure, pulse, and                            oxygen saturations were monitored continuously. The                            Olympus PCF-[id number] ([id number]) Colonoscope was                             introduced through the anus and advanced to the the                            cecum, identified by appendiceal orifice and                            ileocecal valve. The ileocecal valve, appendiceal                            orifice, and rectum were photographed. The quality                            of the bowel preparation was excellent. The                            colonoscopy was performed without difficulty. The                            patient tolerated the procedure well. The bowel                            preparation used was SUPREP via split dose                            instruction. Scope In: 10:51:36 AM Scope Out: 11:06:30 AM Scope Withdrawal Time: 0 hours 12 minutes 2 seconds  Total Procedure Duration: 0 hours 14 minutes 54 seconds  Findings:                 Two polyps were found in the transverse colon. The                            polyps were 1 to 2 mm in size. These polyps were                            removed with a cold snare. Resection and retrieval                            were complete.  Multiple diverticula were found in the sigmoid                            colon.                           Internal hemorrhoids were found during                            retroflexion. The hemorrhoids were small.                            Hypertrophic anal papillae noted.                           The exam was otherwise without abnormality on                            direct and retroflexion views. Complications:            No immediate complications. Estimated blood loss:                            None. Estimated Blood Loss:     Estimated blood loss: none. Impression:               - Two 1 to 2 mm polyps in the transverse colon,                            removed with a cold snare. Resected and retrieved.                           - Diverticulosis in the sigmoid colon.                           - Internal hemorrhoids. Hypertrophic anal  papillae                           - The examination was otherwise normal on direct                            and retroflexion views. Recommendation:           - Repeat colonoscopy is not recommended for                            surveillance.                           - Patient has a contact number available for                            emergencies. The signs and symptoms of potential                            delayed complications were discussed with the  patient. Return to normal activities tomorrow.                            Written discharge instructions were provided to the                            patient.                           - Resume previous diet.                           - Continue present medications.                           - Await pathology results. Wilhemina Bonito. Marina Goodell, MD 03/16/2023 11:13:06 AM This report has been signed electronically.

## 2023-03-16 NOTE — Telephone Encounter (Signed)
Patient took some GasX and took a nap and feels much better after waking up. I encouraged her to call back if she has any further problems and she said she would. No further questions or concerns.

## 2023-03-19 ENCOUNTER — Telehealth: Payer: Self-pay

## 2023-03-19 NOTE — Telephone Encounter (Signed)
Follow up call to pt, lm for pt to call if having any difficulty with normal activities or eating and drinking.  Also to call if any other questions or concerns.  

## 2023-03-20 ENCOUNTER — Encounter: Payer: Self-pay | Admitting: Internal Medicine

## 2023-04-13 DIAGNOSIS — L821 Other seborrheic keratosis: Secondary | ICD-10-CM | POA: Diagnosis not present

## 2023-04-13 DIAGNOSIS — D485 Neoplasm of uncertain behavior of skin: Secondary | ICD-10-CM | POA: Diagnosis not present

## 2023-04-13 DIAGNOSIS — C4441 Basal cell carcinoma of skin of scalp and neck: Secondary | ICD-10-CM | POA: Diagnosis not present

## 2023-04-13 DIAGNOSIS — C44519 Basal cell carcinoma of skin of other part of trunk: Secondary | ICD-10-CM | POA: Diagnosis not present

## 2023-04-13 DIAGNOSIS — Z85828 Personal history of other malignant neoplasm of skin: Secondary | ICD-10-CM | POA: Diagnosis not present

## 2023-04-13 DIAGNOSIS — Q828 Other specified congenital malformations of skin: Secondary | ICD-10-CM | POA: Diagnosis not present

## 2023-04-13 DIAGNOSIS — D171 Benign lipomatous neoplasm of skin and subcutaneous tissue of trunk: Secondary | ICD-10-CM | POA: Diagnosis not present

## 2023-06-15 ENCOUNTER — Other Ambulatory Visit: Payer: Self-pay | Admitting: Obstetrics & Gynecology

## 2023-06-15 DIAGNOSIS — Z1231 Encounter for screening mammogram for malignant neoplasm of breast: Secondary | ICD-10-CM

## 2023-06-22 DIAGNOSIS — C4492 Squamous cell carcinoma of skin, unspecified: Secondary | ICD-10-CM | POA: Diagnosis not present

## 2023-06-22 DIAGNOSIS — K581 Irritable bowel syndrome with constipation: Secondary | ICD-10-CM | POA: Diagnosis not present

## 2023-06-22 DIAGNOSIS — R32 Unspecified urinary incontinence: Secondary | ICD-10-CM | POA: Diagnosis not present

## 2023-06-22 DIAGNOSIS — E559 Vitamin D deficiency, unspecified: Secondary | ICD-10-CM | POA: Diagnosis not present

## 2023-06-22 DIAGNOSIS — C44519 Basal cell carcinoma of skin of other part of trunk: Secondary | ICD-10-CM | POA: Diagnosis not present

## 2023-06-22 DIAGNOSIS — M85859 Other specified disorders of bone density and structure, unspecified thigh: Secondary | ICD-10-CM | POA: Diagnosis not present

## 2023-06-22 DIAGNOSIS — R638 Other symptoms and signs concerning food and fluid intake: Secondary | ICD-10-CM | POA: Diagnosis not present

## 2023-06-22 DIAGNOSIS — Z1159 Encounter for screening for other viral diseases: Secondary | ICD-10-CM | POA: Diagnosis not present

## 2023-06-22 DIAGNOSIS — E039 Hypothyroidism, unspecified: Secondary | ICD-10-CM | POA: Diagnosis not present

## 2023-07-19 DIAGNOSIS — N959 Unspecified menopausal and perimenopausal disorder: Secondary | ICD-10-CM | POA: Diagnosis not present

## 2023-07-19 DIAGNOSIS — Z01419 Encounter for gynecological examination (general) (routine) without abnormal findings: Secondary | ICD-10-CM | POA: Diagnosis not present

## 2023-07-19 DIAGNOSIS — Z6823 Body mass index (BMI) 23.0-23.9, adult: Secondary | ICD-10-CM | POA: Diagnosis not present

## 2023-07-19 DIAGNOSIS — Z124 Encounter for screening for malignant neoplasm of cervix: Secondary | ICD-10-CM | POA: Diagnosis not present

## 2023-07-24 DIAGNOSIS — E039 Hypothyroidism, unspecified: Secondary | ICD-10-CM | POA: Diagnosis not present

## 2023-07-30 DIAGNOSIS — R1030 Lower abdominal pain, unspecified: Secondary | ICD-10-CM | POA: Diagnosis not present

## 2023-07-30 DIAGNOSIS — R278 Other lack of coordination: Secondary | ICD-10-CM | POA: Diagnosis not present

## 2023-07-30 DIAGNOSIS — R150 Incomplete defecation: Secondary | ICD-10-CM | POA: Diagnosis not present

## 2023-07-30 DIAGNOSIS — R3915 Urgency of urination: Secondary | ICD-10-CM | POA: Diagnosis not present

## 2023-07-30 DIAGNOSIS — R351 Nocturia: Secondary | ICD-10-CM | POA: Diagnosis not present

## 2023-07-30 DIAGNOSIS — N393 Stress incontinence (female) (male): Secondary | ICD-10-CM | POA: Diagnosis not present

## 2023-07-30 DIAGNOSIS — R350 Frequency of micturition: Secondary | ICD-10-CM | POA: Diagnosis not present

## 2023-07-30 DIAGNOSIS — K581 Irritable bowel syndrome with constipation: Secondary | ICD-10-CM | POA: Diagnosis not present

## 2023-07-31 DIAGNOSIS — E039 Hypothyroidism, unspecified: Secondary | ICD-10-CM | POA: Diagnosis not present

## 2023-07-31 DIAGNOSIS — Z780 Asymptomatic menopausal state: Secondary | ICD-10-CM | POA: Diagnosis not present

## 2023-07-31 DIAGNOSIS — M8580 Other specified disorders of bone density and structure, unspecified site: Secondary | ICD-10-CM | POA: Diagnosis not present

## 2023-08-02 DIAGNOSIS — R638 Other symptoms and signs concerning food and fluid intake: Secondary | ICD-10-CM | POA: Diagnosis not present

## 2023-08-02 DIAGNOSIS — Z780 Asymptomatic menopausal state: Secondary | ICD-10-CM | POA: Diagnosis not present

## 2023-08-02 DIAGNOSIS — E559 Vitamin D deficiency, unspecified: Secondary | ICD-10-CM | POA: Diagnosis not present

## 2023-08-02 DIAGNOSIS — E039 Hypothyroidism, unspecified: Secondary | ICD-10-CM | POA: Diagnosis not present

## 2023-08-02 DIAGNOSIS — Z1159 Encounter for screening for other viral diseases: Secondary | ICD-10-CM | POA: Diagnosis not present

## 2023-08-14 DIAGNOSIS — H0288A Meibomian gland dysfunction right eye, upper and lower eyelids: Secondary | ICD-10-CM | POA: Diagnosis not present

## 2023-08-14 DIAGNOSIS — Z961 Presence of intraocular lens: Secondary | ICD-10-CM | POA: Diagnosis not present

## 2023-08-14 DIAGNOSIS — H57811 Brow ptosis, right: Secondary | ICD-10-CM | POA: Diagnosis not present

## 2023-08-14 DIAGNOSIS — H43813 Vitreous degeneration, bilateral: Secondary | ICD-10-CM | POA: Diagnosis not present

## 2023-08-14 DIAGNOSIS — H0288B Meibomian gland dysfunction left eye, upper and lower eyelids: Secondary | ICD-10-CM | POA: Diagnosis not present

## 2023-08-15 DIAGNOSIS — R351 Nocturia: Secondary | ICD-10-CM | POA: Diagnosis not present

## 2023-08-15 DIAGNOSIS — R278 Other lack of coordination: Secondary | ICD-10-CM | POA: Diagnosis not present

## 2023-08-15 DIAGNOSIS — R3915 Urgency of urination: Secondary | ICD-10-CM | POA: Diagnosis not present

## 2023-08-15 DIAGNOSIS — K581 Irritable bowel syndrome with constipation: Secondary | ICD-10-CM | POA: Diagnosis not present

## 2023-08-15 DIAGNOSIS — R1030 Lower abdominal pain, unspecified: Secondary | ICD-10-CM | POA: Diagnosis not present

## 2023-08-15 DIAGNOSIS — R350 Frequency of micturition: Secondary | ICD-10-CM | POA: Diagnosis not present

## 2023-08-15 DIAGNOSIS — R150 Incomplete defecation: Secondary | ICD-10-CM | POA: Diagnosis not present

## 2023-08-15 DIAGNOSIS — N393 Stress incontinence (female) (male): Secondary | ICD-10-CM | POA: Diagnosis not present

## 2023-08-17 DIAGNOSIS — E039 Hypothyroidism, unspecified: Secondary | ICD-10-CM | POA: Diagnosis not present

## 2023-08-17 DIAGNOSIS — Z0000 Encounter for general adult medical examination without abnormal findings: Secondary | ICD-10-CM | POA: Diagnosis not present

## 2023-08-17 DIAGNOSIS — E538 Deficiency of other specified B group vitamins: Secondary | ICD-10-CM | POA: Diagnosis not present

## 2023-08-17 DIAGNOSIS — K589 Irritable bowel syndrome without diarrhea: Secondary | ICD-10-CM | POA: Diagnosis not present

## 2023-08-17 DIAGNOSIS — R921 Mammographic calcification found on diagnostic imaging of breast: Secondary | ICD-10-CM | POA: Diagnosis not present

## 2023-08-20 DIAGNOSIS — E538 Deficiency of other specified B group vitamins: Secondary | ICD-10-CM | POA: Diagnosis not present

## 2023-08-27 DIAGNOSIS — R1030 Lower abdominal pain, unspecified: Secondary | ICD-10-CM | POA: Diagnosis not present

## 2023-08-27 DIAGNOSIS — N393 Stress incontinence (female) (male): Secondary | ICD-10-CM | POA: Diagnosis not present

## 2023-08-27 DIAGNOSIS — R350 Frequency of micturition: Secondary | ICD-10-CM | POA: Diagnosis not present

## 2023-08-27 DIAGNOSIS — R351 Nocturia: Secondary | ICD-10-CM | POA: Diagnosis not present

## 2023-08-27 DIAGNOSIS — R278 Other lack of coordination: Secondary | ICD-10-CM | POA: Diagnosis not present

## 2023-08-27 DIAGNOSIS — R3915 Urgency of urination: Secondary | ICD-10-CM | POA: Diagnosis not present

## 2023-08-27 DIAGNOSIS — R150 Incomplete defecation: Secondary | ICD-10-CM | POA: Diagnosis not present

## 2023-08-27 DIAGNOSIS — K581 Irritable bowel syndrome with constipation: Secondary | ICD-10-CM | POA: Diagnosis not present

## 2023-09-17 DIAGNOSIS — R350 Frequency of micturition: Secondary | ICD-10-CM | POA: Diagnosis not present

## 2023-09-17 DIAGNOSIS — R351 Nocturia: Secondary | ICD-10-CM | POA: Diagnosis not present

## 2023-09-17 DIAGNOSIS — R1030 Lower abdominal pain, unspecified: Secondary | ICD-10-CM | POA: Diagnosis not present

## 2023-09-17 DIAGNOSIS — N393 Stress incontinence (female) (male): Secondary | ICD-10-CM | POA: Diagnosis not present

## 2023-09-17 DIAGNOSIS — R3915 Urgency of urination: Secondary | ICD-10-CM | POA: Diagnosis not present

## 2023-09-17 DIAGNOSIS — K581 Irritable bowel syndrome with constipation: Secondary | ICD-10-CM | POA: Diagnosis not present

## 2023-09-17 DIAGNOSIS — R278 Other lack of coordination: Secondary | ICD-10-CM | POA: Diagnosis not present

## 2023-09-17 DIAGNOSIS — R150 Incomplete defecation: Secondary | ICD-10-CM | POA: Diagnosis not present

## 2023-09-21 DIAGNOSIS — R928 Other abnormal and inconclusive findings on diagnostic imaging of breast: Secondary | ICD-10-CM | POA: Diagnosis not present

## 2023-09-21 DIAGNOSIS — E538 Deficiency of other specified B group vitamins: Secondary | ICD-10-CM | POA: Diagnosis not present

## 2023-10-07 ENCOUNTER — Other Ambulatory Visit: Payer: Self-pay | Admitting: Internal Medicine

## 2023-10-09 DIAGNOSIS — L570 Actinic keratosis: Secondary | ICD-10-CM | POA: Diagnosis not present

## 2023-10-09 DIAGNOSIS — Z85828 Personal history of other malignant neoplasm of skin: Secondary | ICD-10-CM | POA: Diagnosis not present

## 2023-10-09 DIAGNOSIS — L821 Other seborrheic keratosis: Secondary | ICD-10-CM | POA: Diagnosis not present

## 2023-10-23 DIAGNOSIS — E538 Deficiency of other specified B group vitamins: Secondary | ICD-10-CM | POA: Diagnosis not present

## 2023-11-27 DIAGNOSIS — E538 Deficiency of other specified B group vitamins: Secondary | ICD-10-CM | POA: Diagnosis not present

## 2023-12-03 DIAGNOSIS — N6001 Solitary cyst of right breast: Secondary | ICD-10-CM | POA: Diagnosis not present

## 2023-12-03 DIAGNOSIS — N644 Mastodynia: Secondary | ICD-10-CM | POA: Diagnosis not present

## 2023-12-27 DIAGNOSIS — E538 Deficiency of other specified B group vitamins: Secondary | ICD-10-CM | POA: Diagnosis not present

## 2024-01-04 DIAGNOSIS — N6001 Solitary cyst of right breast: Secondary | ICD-10-CM | POA: Diagnosis not present

## 2024-01-04 DIAGNOSIS — R92331 Mammographic heterogeneous density, right breast: Secondary | ICD-10-CM | POA: Diagnosis not present

## 2024-01-04 DIAGNOSIS — N644 Mastodynia: Secondary | ICD-10-CM | POA: Diagnosis not present

## 2024-01-24 DIAGNOSIS — E538 Deficiency of other specified B group vitamins: Secondary | ICD-10-CM | POA: Diagnosis not present

## 2024-02-16 ENCOUNTER — Other Ambulatory Visit: Payer: Self-pay | Admitting: Internal Medicine

## 2024-02-25 DIAGNOSIS — E538 Deficiency of other specified B group vitamins: Secondary | ICD-10-CM | POA: Diagnosis not present

## 2024-03-25 DIAGNOSIS — E538 Deficiency of other specified B group vitamins: Secondary | ICD-10-CM | POA: Diagnosis not present

## 2024-04-14 DIAGNOSIS — M79672 Pain in left foot: Secondary | ICD-10-CM | POA: Diagnosis not present

## 2024-04-14 DIAGNOSIS — M19071 Primary osteoarthritis, right ankle and foot: Secondary | ICD-10-CM | POA: Diagnosis not present

## 2024-04-14 DIAGNOSIS — G5790 Unspecified mononeuropathy of unspecified lower limb: Secondary | ICD-10-CM | POA: Diagnosis not present

## 2024-04-14 DIAGNOSIS — M84375A Stress fracture, left foot, initial encounter for fracture: Secondary | ICD-10-CM | POA: Diagnosis not present

## 2024-04-14 DIAGNOSIS — M19072 Primary osteoarthritis, left ankle and foot: Secondary | ICD-10-CM | POA: Diagnosis not present

## 2024-04-14 DIAGNOSIS — M79671 Pain in right foot: Secondary | ICD-10-CM | POA: Diagnosis not present

## 2024-04-24 DIAGNOSIS — E538 Deficiency of other specified B group vitamins: Secondary | ICD-10-CM | POA: Diagnosis not present

## 2024-04-29 DIAGNOSIS — G5790 Unspecified mononeuropathy of unspecified lower limb: Secondary | ICD-10-CM | POA: Diagnosis not present

## 2024-04-29 DIAGNOSIS — M84375A Stress fracture, left foot, initial encounter for fracture: Secondary | ICD-10-CM | POA: Diagnosis not present

## 2024-05-26 DIAGNOSIS — E538 Deficiency of other specified B group vitamins: Secondary | ICD-10-CM | POA: Diagnosis not present

## 2024-06-03 DIAGNOSIS — M84375A Stress fracture, left foot, initial encounter for fracture: Secondary | ICD-10-CM | POA: Diagnosis not present

## 2024-06-03 DIAGNOSIS — X58XXXA Exposure to other specified factors, initial encounter: Secondary | ICD-10-CM | POA: Diagnosis not present

## 2024-06-26 DIAGNOSIS — E538 Deficiency of other specified B group vitamins: Secondary | ICD-10-CM | POA: Diagnosis not present

## 2024-07-21 DIAGNOSIS — L820 Inflamed seborrheic keratosis: Secondary | ICD-10-CM | POA: Diagnosis not present

## 2024-07-21 DIAGNOSIS — L821 Other seborrheic keratosis: Secondary | ICD-10-CM | POA: Diagnosis not present

## 2024-07-21 DIAGNOSIS — Z85828 Personal history of other malignant neoplasm of skin: Secondary | ICD-10-CM | POA: Diagnosis not present

## 2024-07-28 DIAGNOSIS — F419 Anxiety disorder, unspecified: Secondary | ICD-10-CM | POA: Diagnosis not present

## 2024-07-28 DIAGNOSIS — N959 Unspecified menopausal and perimenopausal disorder: Secondary | ICD-10-CM | POA: Diagnosis not present

## 2024-07-28 DIAGNOSIS — Z01419 Encounter for gynecological examination (general) (routine) without abnormal findings: Secondary | ICD-10-CM | POA: Diagnosis not present

## 2024-07-28 DIAGNOSIS — B009 Herpesviral infection, unspecified: Secondary | ICD-10-CM | POA: Diagnosis not present

## 2024-07-28 DIAGNOSIS — M8588 Other specified disorders of bone density and structure, other site: Secondary | ICD-10-CM | POA: Diagnosis not present

## 2024-07-28 DIAGNOSIS — Z6824 Body mass index (BMI) 24.0-24.9, adult: Secondary | ICD-10-CM | POA: Diagnosis not present

## 2024-07-28 DIAGNOSIS — N958 Other specified menopausal and perimenopausal disorders: Secondary | ICD-10-CM | POA: Diagnosis not present

## 2024-07-29 DIAGNOSIS — E538 Deficiency of other specified B group vitamins: Secondary | ICD-10-CM | POA: Diagnosis not present

## 2024-08-13 DIAGNOSIS — E039 Hypothyroidism, unspecified: Secondary | ICD-10-CM | POA: Diagnosis not present

## 2024-08-13 DIAGNOSIS — M8580 Other specified disorders of bone density and structure, unspecified site: Secondary | ICD-10-CM | POA: Diagnosis not present

## 2024-08-20 DIAGNOSIS — M8580 Other specified disorders of bone density and structure, unspecified site: Secondary | ICD-10-CM | POA: Diagnosis not present

## 2024-08-20 DIAGNOSIS — H0288A Meibomian gland dysfunction right eye, upper and lower eyelids: Secondary | ICD-10-CM | POA: Diagnosis not present

## 2024-08-20 DIAGNOSIS — H02401 Unspecified ptosis of right eyelid: Secondary | ICD-10-CM | POA: Diagnosis not present

## 2024-08-20 DIAGNOSIS — H0288B Meibomian gland dysfunction left eye, upper and lower eyelids: Secondary | ICD-10-CM | POA: Diagnosis not present

## 2024-08-20 DIAGNOSIS — M8430XA Stress fracture, unspecified site, initial encounter for fracture: Secondary | ICD-10-CM | POA: Diagnosis not present

## 2024-08-20 DIAGNOSIS — Z961 Presence of intraocular lens: Secondary | ICD-10-CM | POA: Diagnosis not present

## 2024-08-20 DIAGNOSIS — H43813 Vitreous degeneration, bilateral: Secondary | ICD-10-CM | POA: Diagnosis not present

## 2024-09-03 DIAGNOSIS — E785 Hyperlipidemia, unspecified: Secondary | ICD-10-CM | POA: Diagnosis not present

## 2024-09-03 DIAGNOSIS — E538 Deficiency of other specified B group vitamins: Secondary | ICD-10-CM | POA: Diagnosis not present

## 2024-09-03 DIAGNOSIS — E039 Hypothyroidism, unspecified: Secondary | ICD-10-CM | POA: Diagnosis not present

## 2024-09-08 DIAGNOSIS — M84375D Stress fracture, left foot, subsequent encounter for fracture with routine healing: Secondary | ICD-10-CM | POA: Diagnosis not present

## 2024-09-08 DIAGNOSIS — E039 Hypothyroidism, unspecified: Secondary | ICD-10-CM | POA: Diagnosis not present

## 2024-09-08 DIAGNOSIS — E785 Hyperlipidemia, unspecified: Secondary | ICD-10-CM | POA: Diagnosis not present

## 2024-09-08 DIAGNOSIS — E538 Deficiency of other specified B group vitamins: Secondary | ICD-10-CM | POA: Diagnosis not present

## 2024-09-08 DIAGNOSIS — K5909 Other constipation: Secondary | ICD-10-CM | POA: Diagnosis not present

## 2024-09-08 DIAGNOSIS — Z23 Encounter for immunization: Secondary | ICD-10-CM | POA: Diagnosis not present

## 2024-09-08 DIAGNOSIS — Z0000 Encounter for general adult medical examination without abnormal findings: Secondary | ICD-10-CM | POA: Diagnosis not present

## 2024-09-15 DIAGNOSIS — H02423 Myogenic ptosis of bilateral eyelids: Secondary | ICD-10-CM | POA: Diagnosis not present

## 2024-09-15 DIAGNOSIS — H02834 Dermatochalasis of left upper eyelid: Secondary | ICD-10-CM | POA: Diagnosis not present

## 2024-09-15 DIAGNOSIS — H02831 Dermatochalasis of right upper eyelid: Secondary | ICD-10-CM | POA: Diagnosis not present

## 2024-11-25 ENCOUNTER — Other Ambulatory Visit: Payer: Self-pay | Admitting: Internal Medicine

## 2024-11-26 ENCOUNTER — Telehealth: Payer: Self-pay | Admitting: Internal Medicine

## 2024-11-26 NOTE — Telephone Encounter (Signed)
 Lm on vm that patient needs an office visit - once she schedules it I can send in a refill of Amitiza  for her

## 2024-11-26 NOTE — Telephone Encounter (Signed)
 Inbound call from patient requesting a refill for lubiprostone . Advised patient she is needing on office visit for future refills. States she now lives in [place]. Requesting to speak regarding appointment and refill. Please advise, thank you.

## 2024-11-26 NOTE — Telephone Encounter (Signed)
 Patient returning call, has been scheduled for 12/29/2024  Please advise  Thank you

## 2024-11-27 MED ORDER — LUBIPROSTONE 8 MCG PO CAPS: 8.0000 ug | ORAL_CAPSULE | Freq: Two times a day (BID) | ORAL | 0 refills | Status: AC

## 2024-11-27 NOTE — Telephone Encounter (Signed)
 Amitiza refilled

## 2024-11-27 NOTE — Addendum Note (Signed)
 Addended by: Tamma Brigandi K on: 11/27/2024 11:15 AM   Modules accepted: Orders

## 2024-12-29 ENCOUNTER — Ambulatory Visit: Admitting: Nurse Practitioner
